# Patient Record
Sex: Female | Born: 1980 | Race: White | Hispanic: No | Marital: Married | Smoking: Current every day smoker
Health system: Southern US, Community
[De-identification: ages and names within clinical notes are randomized; demographics above are authoritative.]

## PROBLEM LIST (undated history)

## (undated) DIAGNOSIS — M329 Systemic lupus erythematosus, unspecified: Secondary | ICD-10-CM

## (undated) DIAGNOSIS — F319 Bipolar disorder, unspecified: Secondary | ICD-10-CM

## (undated) DIAGNOSIS — F329 Major depressive disorder, single episode, unspecified: Secondary | ICD-10-CM

## (undated) DIAGNOSIS — B1920 Unspecified viral hepatitis C without hepatic coma: Secondary | ICD-10-CM

## (undated) DIAGNOSIS — F419 Anxiety disorder, unspecified: Secondary | ICD-10-CM

## (undated) DIAGNOSIS — F32A Depression, unspecified: Secondary | ICD-10-CM

## (undated) DIAGNOSIS — F1920 Other psychoactive substance dependence, uncomplicated: Secondary | ICD-10-CM

## (undated) DIAGNOSIS — K2970 Gastritis, unspecified, without bleeding: Secondary | ICD-10-CM

## (undated) DIAGNOSIS — E7800 Pure hypercholesterolemia, unspecified: Secondary | ICD-10-CM

## (undated) HISTORY — PX: TONSILLECTOMY: SUR1361

## (undated) HISTORY — DX: Unspecified viral hepatitis C without hepatic coma: B19.20

## (undated) HISTORY — PX: TUBAL LIGATION: SHX77

---

## 2012-09-06 DIAGNOSIS — Z720 Tobacco use: Secondary | ICD-10-CM | POA: Insufficient documentation

## 2012-09-06 DIAGNOSIS — Z6835 Body mass index (BMI) 35.0-35.9, adult: Secondary | ICD-10-CM | POA: Insufficient documentation

## 2012-09-06 DIAGNOSIS — R509 Fever, unspecified: Secondary | ICD-10-CM | POA: Insufficient documentation

## 2012-11-12 ENCOUNTER — Ambulatory Visit: Payer: Self-pay | Admitting: Family Medicine

## 2012-12-29 DIAGNOSIS — M545 Low back pain, unspecified: Secondary | ICD-10-CM | POA: Insufficient documentation

## 2012-12-29 DIAGNOSIS — F410 Panic disorder [episodic paroxysmal anxiety] without agoraphobia: Secondary | ICD-10-CM | POA: Insufficient documentation

## 2013-01-02 ENCOUNTER — Encounter (HOSPITAL_COMMUNITY): Payer: Self-pay | Admitting: Emergency Medicine

## 2013-01-02 ENCOUNTER — Emergency Department (HOSPITAL_COMMUNITY)
Admission: EM | Admit: 2013-01-02 | Discharge: 2013-01-03 | Discharge status: Against Medical Advice | Payer: BC Managed Care – PPO | Attending: Emergency Medicine | Admitting: Emergency Medicine

## 2013-01-02 DIAGNOSIS — M7989 Other specified soft tissue disorders: Secondary | ICD-10-CM | POA: Insufficient documentation

## 2013-01-02 DIAGNOSIS — K089 Disorder of teeth and supporting structures, unspecified: Secondary | ICD-10-CM | POA: Insufficient documentation

## 2013-01-02 DIAGNOSIS — F17200 Nicotine dependence, unspecified, uncomplicated: Secondary | ICD-10-CM | POA: Insufficient documentation

## 2013-01-02 HISTORY — DX: Major depressive disorder, single episode, unspecified: F32.9

## 2013-01-02 HISTORY — DX: Anxiety disorder, unspecified: F41.9

## 2013-01-02 HISTORY — DX: Depression, unspecified: F32.A

## 2013-01-02 MED ORDER — ONDANSETRON 4 MG PO TBDP
4.0000 mg | ORAL_TABLET | Freq: Once | ORAL | Status: AC
  Administered 2013-01-03: 4 mg via ORAL
  Filled 2013-01-02: qty 1

## 2013-01-02 MED ORDER — HYDROCODONE-ACETAMINOPHEN 5-325 MG PO TABS
1.0000 | ORAL_TABLET | ORAL | Status: AC
  Administered 2013-01-03: 1 via ORAL
  Filled 2013-01-02: qty 1

## 2013-01-02 NOTE — ED Notes (Signed)
Pt reports dental pain and right buttock pain. States that she has an appt to have her tooth extracted but has run out of her pain medicine. States that right buttock became swollen and painful since yesterday. Rates pain for both complaints 10/10.

## 2013-01-02 NOTE — ED Notes (Signed)
C/o R lower toothache x 6 hours.  States she is currently taking antibiotics for same and planned to make an appt for extraction once antibiotics completed.  Also c/o swelling to R buttocks x 1 week.

## 2013-01-03 NOTE — ED Notes (Signed)
Pt reports that she can no longer wait to be seen by the EDP. Is requesting to leave. Encouraged pt to wait for the PA, but pt insisted on leaving AMA and following up with ehr dentist and PCP tomorrow.

## 2013-06-30 DIAGNOSIS — F988 Other specified behavioral and emotional disorders with onset usually occurring in childhood and adolescence: Secondary | ICD-10-CM | POA: Insufficient documentation

## 2013-06-30 DIAGNOSIS — F251 Schizoaffective disorder, depressive type: Secondary | ICD-10-CM | POA: Insufficient documentation

## 2013-11-28 DIAGNOSIS — M79642 Pain in left hand: Secondary | ICD-10-CM | POA: Insufficient documentation

## 2014-02-24 DIAGNOSIS — G5600 Carpal tunnel syndrome, unspecified upper limb: Secondary | ICD-10-CM | POA: Insufficient documentation

## 2014-02-24 DIAGNOSIS — S6720XA Crushing injury of unspecified hand, initial encounter: Secondary | ICD-10-CM | POA: Insufficient documentation

## 2014-08-09 ENCOUNTER — Emergency Department (HOSPITAL_COMMUNITY)
Admission: EM | Admit: 2014-08-09 | Discharge: 2014-08-09 | Disposition: A | Discharge status: Home / Self Care | Payer: BLUE CROSS/BLUE SHIELD | Attending: Emergency Medicine | Admitting: Emergency Medicine

## 2014-08-09 ENCOUNTER — Encounter (HOSPITAL_COMMUNITY): Payer: Self-pay | Admitting: *Deleted

## 2014-08-09 DIAGNOSIS — F419 Anxiety disorder, unspecified: Secondary | ICD-10-CM | POA: Diagnosis not present

## 2014-08-09 DIAGNOSIS — F1210 Cannabis abuse, uncomplicated: Secondary | ICD-10-CM | POA: Diagnosis not present

## 2014-08-09 DIAGNOSIS — R079 Chest pain, unspecified: Secondary | ICD-10-CM | POA: Diagnosis not present

## 2014-08-09 DIAGNOSIS — R002 Palpitations: Secondary | ICD-10-CM | POA: Diagnosis not present

## 2014-08-09 DIAGNOSIS — G479 Sleep disorder, unspecified: Secondary | ICD-10-CM | POA: Diagnosis not present

## 2014-08-09 DIAGNOSIS — Z79899 Other long term (current) drug therapy: Secondary | ICD-10-CM | POA: Insufficient documentation

## 2014-08-09 DIAGNOSIS — F319 Bipolar disorder, unspecified: Secondary | ICD-10-CM | POA: Insufficient documentation

## 2014-08-09 DIAGNOSIS — Z8659 Personal history of other mental and behavioral disorders: Secondary | ICD-10-CM

## 2014-08-09 DIAGNOSIS — F32A Depression, unspecified: Secondary | ICD-10-CM

## 2014-08-09 DIAGNOSIS — Z720 Tobacco use: Secondary | ICD-10-CM | POA: Insufficient documentation

## 2014-08-09 DIAGNOSIS — Z008 Encounter for other general examination: Secondary | ICD-10-CM | POA: Diagnosis present

## 2014-08-09 DIAGNOSIS — F329 Major depressive disorder, single episode, unspecified: Secondary | ICD-10-CM

## 2014-08-09 HISTORY — DX: Bipolar disorder, unspecified: F31.9

## 2014-08-09 LAB — RAPID URINE DRUG SCREEN, HOSP PERFORMED
AMPHETAMINES: NOT DETECTED
Barbiturates: POSITIVE — AB
Benzodiazepines: NOT DETECTED
Cocaine: NOT DETECTED
Opiates: NOT DETECTED
Tetrahydrocannabinol: POSITIVE — AB
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
## 2014-08-09 NOTE — Discharge Instructions (Signed)
Go to [facility] for out patient care. Address: [address], [place], [place] [address] [phone]  Call for a follow up appointment with a Family or Primary Care Provider.  Return if Symptoms worsen.   Take medication as prescribed.    Emergency Department Resource Guide 1) Find a Doctor and Pay Out of Pocket Although you won't have to find out who is covered by your insurance plan, it is a good idea to ask around and get recommendations. You will then need to call the office and see if the doctor you have chosen will accept you as a new patient and what types of options they offer for patients who are self-pay. Some doctors offer discounts or will set up payment plans for their patients who do not have insurance, but you will need to ask so you aren't surprised when you get to your appointment.  2) Contact Your Local Health Department Not all health departments have doctors that can see patients for sick visits, but many do, so it is worth a call to see if yours does. If you don't know where your local health department is, you can check in your phone book. The CDC also has a tool to help you locate your state's health department, and many state websites also have listings of all of their local health departments.  3) Find a [facility] If your illness is not likely to be very severe or complicated, you may want to try a walk in clinic. These are popping up all over the country in pharmacies, drugstores, and shopping centers. They're usually staffed by nurse practitioners or physician assistants that have been trained to treat common illnesses and complaints. They're usually fairly quick and inexpensive. However, if you have serious medical issues or chronic medical problems, these are probably not your best option.  No Primary Care Doctor: - Call Health Connect at  [phone] - they can help you locate a primary care doctor that  accepts your insurance, provides certain services,  etc. - Physician Referral Service- [phone]  Chronic Pain Problems: Organization         Address  Phone   Notes  [facility]  [phone] Patients need to be referred by their primary care doctor.   Medication Assistance: Organization         Address  Phone   Notes  [facility] Medication [facility] [place], [place] [address] [phone] --Must be a resident of [facility] -- Must have NO insurance coverage whatsoever (no Medicaid/ Medicare, etc.) -- The pt. MUST have a primary care doctor that directs their care regularly and follows them in the community   MedAssist  [phone]   [employer]  [phone]    Agencies that provide inexpensive medical care: Organization         Address  Phone   Notes  [place]  [phone]   [facility]    [phone]   [facility] [place], [place] [address] [phone]   [place] [address] [place] [phone]   Planned Parenthood    [phone]   [facility]    [phone]   [place] and [address], [place] Phone:  [phone], Fax:  [phone] Hours of Operation:  9 am - 6 pm, M-F.  Also  accepts Medicaid/Medicare and self-pay.  [facility] for [place], [address], [place] Phone: [phone], Fax: [phone]. Hours of Operation:  8:30 am - 5:30 pm, M-F.  Also accepts Medicaid and self-pay.  [facility] [place] [address] [place] Phone: [phone]   [place], [place], [place] [phone], Ext. [phone] Mondays & Thursdays: 7-9 AM.  First 15 patients are seen on a first come, first serve basis.    [place] Providers:  Organization         Address  Phone   Notes  [facility] [address], [place] [phone] Also accepts self-pay patients.  [facility] [address], [place]  [phone]   [place], [address], [place] [phone]   [facility] [address] [place] [phone]   Lucianne Lei [address], [place]   [phone] Only accepts [place] Access [place] patients after they have their name applied to their card.   Self-Pay (no insurance) in [facility]:  Organization         Address  Phone   Notes  Sickle Cell Patients, [facility] [place] [phone]   [facility] [place] [phone]   [facility] [place]  [place], [place], [place] [phone]   [facility]/Dr. Osei-Bonsu  [address], [place] or [address], [place] [phone] Phone number for both [place] and [place] locations is the same.  Urgent Medical and [facility] [address], [place] [phone]   [facility] [address] [place] or [address] [phone]   [facility] [address], [place] [phone], phone; [phone], fax Sees patients 1st and 3rd Saturday of every month.  Must not qualify for public or private insurance (i.e. Medicaid, Medicare, [place] Health Choice, Veterans' Benefits)  Household income should be no more than 200% of the poverty level The clinic cannot treat you if you are pregnant or think you are pregnant  Sexually transmitted diseases are not treated at the clinic.    Dental Care: Organization         Address  Phone  Notes  [facility] Department of [facility] [place] [phone] Accepts children up to  age 73 who are enrolled in [place] or [place]; pregnant women with a Medicaid card; and children who have applied for Medicaid or [place] Health Choice, but were declined, whose parents can pay a reduced fee at time of service.  [facility] Department of [facility]  [address], [place] [phone] Accepts children up to age 74 who are enrolled in [place] or [place]; pregnant women with a Medicaid card; and children who have applied for Medicaid or  Health Choice, but were declined, whose parents can pay a reduced fee at time of service.  [place] Adult Dental Access PROGRAM  [place] [phone] Patients are seen by appointment only. Walk-ins are not accepted. [place] will see patients 74 years of age and older. Monday - Tuesday (8am-5pm)  Most Wednesdays (8:30-5pm) $30 per visit, cash only  [facility] Adult [employer] PROGRAM  [address], [facility] [phone] Patients are seen by appointment only. Walk-ins are not accepted. [place] will see patients 52 years of age and older. One Wednesday Evening (Monthly: Volunteer Based).  $30 per visit, cash only  [place]  [phone] for adults; Children under age 41, call Graduate Pediatric Dentistry at [phone]. Children aged 70-14, please call [phone] to request a pediatric application.  Dental services are provided in all areas of dental care including fillings, crowns and bridges, complete and partial dentures, implants, gum treatment, root canals, and extractions. Preventive care is also provided. Treatment is provided to both adults and children. Patients are selected via a lottery and there is often a waiting list.   [facility] [address] [place]  [phone] [email or web address]   [facility] [address] [place], [place] [phone], Ext. [phone] Second and Fourth Thursday of  each month, opens at 6:30 AM; Clinic ends at 9 AM.  Patients are seen on a first-come first-served basis, and a limited number are seen during each clinic.   [facility]  [address] Hillard Danker [place], [place] [phone]   Eligibility Requirements You must have lived in [place], [place], or [place] counties for at least the last three months.   You cannot be eligible for state or federal sponsored [employer], including [employer], [place], or [employer].   You generally cannot be eligible for healthcare insurance through your employer.    How to apply: Eligibility screenings are held every Tuesday and Wednesday afternoon from 1:00 pm until 4:00 pm. You do not need an appointment for the interview!  [facility] [address] [place],    [place]  [place] Department  [place]  [phone]    Behavioral Health Resources in the Community: Intensive Outpatient Programs Organization         Address  Phone  Notes  [place]. [address], [place], [place] [phone]   [facility] Outpatient [address], [place], [place]   ADS: Alcohol & Drug Svcs [address], [place], [place]   [place] [address],  [place], [place] or [phone]   Substance Abuse Resources Organization         Address  Phone  Notes  Alcohol and Drug Services  [phone]   [place]  [phone]   [facility]   Chinita Pester  [phone]   Residential & Outpatient Substance Abuse Program  [phone]   Psychological Services Organization         Address  Phone  Notes  [facility] [place]  [place]  [phone]   [place] [address]  [place] or [phone]    Mobile Crisis Teams Organization         Address  Phone  Notes  [facility]rapeutic Alternatives, Mobile Crisis Care Unit  [phone]   Assertive Psychotherapeutic Services  [address] [place], [place]   [place] Levels [address] [place] [phone]    Self-Help/Support Groups Organization         Address  Phone  Notes  Mental Health Assoc. of [place] - variety of support groups  [place] Call for more information  Narcotics Anonymous (NA), Caring Services [address], [employer] [place]  2 meetings at this location   Special educational needs teacher         Address  Phone  Notes  ASAP Residential Treatment [place],    [place]  [phone]   [facility]  [address], [place] [phone], [place], [place]   [place], [place] [phone] Admissions: 8am-3pm M-F  Incentives Substance [place] [address],    [place], [place] [phone]   [facility] [address] [place], [place], [place]   [facility] [address]  [place], [place]   [facility] Programs - Intensive Outpatient [address] Kristeen [address], [place], [place]   [facility] ([place].) [place].,  [place], [place] [phone] or [phone]   [facility]idential Treatment Services (RTS) [address] [place], [place] Accepts Medicaid  [facility] [place] [address],  [place] [phone] Substance Abuse/Addiction Treatment   [facility] Organization         Address  Phone  Notes  [facility]  [phone]   Domenic Schwab, PhD [address] Arlis Porta [place], [place]   [phone] or [phone]   [place], [place] [phone]     [facility] [address], [place], [place] [phone] Insurance/Medicaid/sponsorship through [facility] and Families [address], [address]                                    [place], [place] [phone] [place] [address], [place] [phone]    Dr. Adele Schilder  [phone]   [facility] Dept. 1) [address], [place] 2) [place] 3)  [place], [place] [phone]  [phone]   [place] [phone] or [phone] (After Hours)

## 2014-08-09 NOTE — ED Notes (Signed)
Pt reports RUQ pain for several months . Pt was an inpatient at [facility] for treat ment of Bi-Polar disorder . Pt was D/C after 2 day inpatient  Treatment. Pt D/C on Keppra and has been taking Keppra 2 x day. Ptg last saw PCP one month ago for blood work.

## 2014-08-09 NOTE — ED Notes (Signed)
Pt comfortable with discharge and follow up instructions. Pt declines wheelchair, escorted to waiting area by this RN. No Prescriptions 
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
## 2014-10-23 ENCOUNTER — Emergency Department (HOSPITAL_COMMUNITY)
Admission: EM | Admit: 2014-10-23 | Discharge: 2014-10-23 | Disposition: A | Discharge status: Home / Self Care | Payer: BLUE CROSS/BLUE SHIELD | Attending: Emergency Medicine | Admitting: Emergency Medicine

## 2014-10-23 ENCOUNTER — Encounter (HOSPITAL_COMMUNITY): Payer: Self-pay | Admitting: *Deleted

## 2014-10-23 DIAGNOSIS — Z8639 Personal history of other endocrine, nutritional and metabolic disease: Secondary | ICD-10-CM | POA: Diagnosis not present

## 2014-10-23 DIAGNOSIS — Z720 Tobacco use: Secondary | ICD-10-CM | POA: Diagnosis not present

## 2014-10-23 DIAGNOSIS — L739 Follicular disorder, unspecified: Secondary | ICD-10-CM | POA: Diagnosis not present

## 2014-10-23 DIAGNOSIS — L0231 Cutaneous abscess of buttock: Secondary | ICD-10-CM | POA: Diagnosis present

## 2014-10-23 DIAGNOSIS — Z8659 Personal history of other mental and behavioral disorders: Secondary | ICD-10-CM | POA: Insufficient documentation

## 2014-10-23 HISTORY — DX: Pure hypercholesterolemia, unspecified: E78.00

## 2014-10-23 MED ORDER — SULFAMETHOXAZOLE-TRIMETHOPRIM 800-160 MG PO TABS: 2.0000 | ORAL_TABLET | Freq: Two times a day (BID) | ORAL | Status: DC

## 2014-10-23 MED ORDER — CLINDAMYCIN PHOSPHATE 1 % EX GEL
Freq: Two times a day (BID) | CUTANEOUS | Status: DC
Start: 2014-10-23 — End: 2015-07-29

## 2014-10-23 NOTE — ED Provider Notes (Signed)
CSN: [id number]     Arrival date & time 10/23/14  2595 History   First MD Initiated Contact with Patient 10/23/14 0825     Chief Complaint  Patient presents with  . Abscess     (Consider location/radiation/quality/duration/timing/severity/associated sxs/prior Treatment) HPI Comments: Patient presents today with a chief complaint of abscess.  She reports that she has had four tender raised erythematous areas of the left buttocks.  She reports that these areas have been present for the past 3 days and are gradually worsening.  She has not noticed any drainage from the area.  She denies fever at this time, but reports that she has had a fever intermittently over the past 2-3 days.  She reports that she was nauseous and had an episode of vomiting a few days ago, but no nausea at this time.  No treatment prior to arrival. No history of DM or HIV.    The history is provided by the patient.    Past Medical History  Diagnosis Date  . Anxiety   . Depression   . Bipolar affective disorder   . High cholesterol    Past Surgical History  Procedure Laterality Date  . Cesarean section    . Tonsillectomy     History reviewed. No pertinent family history. History  Substance Use Topics  . Smoking status: Current Every Day Smoker  . Smokeless tobacco: Not on file  . Alcohol Use: No   OB History    No data available     Review of Systems  All other systems reviewed and are negative.     Allergies  Review of patient's allergies indicates no known allergies.  Home Medications   Prior to Admission medications   Not on File   BP 124/72 mmHg  Pulse 102  Temp(Src) 98.2 F (36.8 C) (Oral)  Resp 18  SpO2 97%  LMP 10/23/2014 Physical Exam  Constitutional: She appears well-developed and well-nourished.  HENT:  Head: Normocephalic and atraumatic.  Mouth/Throat: Oropharynx is clear and moist.  Neck: Normal range of motion. Neck supple.  Cardiovascular: Normal rate, regular rhythm and  normal heart sounds.   Pulmonary/Chest: Effort normal and breath sounds normal.  Abdominal: Soft. Bowel sounds are normal. She exhibits no distension and no mass. There is no tenderness. There is no rebound and no guarding.  Neurological: She is alert.  Skin: Skin is warm and dry.  Four separate 1 cm erythematous tender papular inflamed areas of the left buttocks.  No drainage.  No fluctuance.    Psychiatric: She has a normal mood and affect.  Nursing note and vitals reviewed.   ED Course  Procedures (including critical care time) Labs Review Labs Reviewed - No data to display  Imaging Review No results found.   EKG Interpretation None      MDM   Final diagnoses:  None   Patient presents today with a chief complaint of abscess.   On exam she has 4 small raised, erythematous, non fluctuant areas of her left buttocks.   Areas evaluated with bed side ultrasound and no fluid collection visualized.  Appearance most consistent with a Folliculitis.  Patient started on antibiotics.  She is afebrile and non toxic appearing.  Stable for discharge.  Return precautions given.    Hyman Bible, PA-C 10/24/14 1556  Debby Freiberg, MD 10/26/14 [phone]

## 2014-10-23 NOTE — ED Notes (Signed)
Pt states she has had these bumps since Wednesday, reports nausea and "I'm just very sick"; reports measured fevers at home of 102.7. Temp at this time 99.1. NAD. VSS.

## 2014-10-23 NOTE — ED Notes (Signed)
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 Pt now has back pain, leg pain, nausea and fever.

## 2015-07-29 ENCOUNTER — Emergency Department (HOSPITAL_BASED_OUTPATIENT_CLINIC_OR_DEPARTMENT_OTHER)
Admission: EM | Admit: 2015-07-29 | Discharge: 2015-07-29 | Disposition: A | Discharge status: Home / Self Care | Payer: BLUE CROSS/BLUE SHIELD | Attending: Emergency Medicine | Admitting: Emergency Medicine

## 2015-07-29 ENCOUNTER — Emergency Department (HOSPITAL_BASED_OUTPATIENT_CLINIC_OR_DEPARTMENT_OTHER): Payer: BLUE CROSS/BLUE SHIELD

## 2015-07-29 ENCOUNTER — Encounter (HOSPITAL_BASED_OUTPATIENT_CLINIC_OR_DEPARTMENT_OTHER): Payer: Self-pay | Admitting: *Deleted

## 2015-07-29 DIAGNOSIS — Z3202 Encounter for pregnancy test, result negative: Secondary | ICD-10-CM | POA: Insufficient documentation

## 2015-07-29 DIAGNOSIS — M25531 Pain in right wrist: Secondary | ICD-10-CM | POA: Diagnosis not present

## 2015-07-29 DIAGNOSIS — M79641 Pain in right hand: Secondary | ICD-10-CM | POA: Insufficient documentation

## 2015-07-29 DIAGNOSIS — M25532 Pain in left wrist: Secondary | ICD-10-CM | POA: Insufficient documentation

## 2015-07-29 DIAGNOSIS — Z792 Long term (current) use of antibiotics: Secondary | ICD-10-CM | POA: Insufficient documentation

## 2015-07-29 DIAGNOSIS — Z79899 Other long term (current) drug therapy: Secondary | ICD-10-CM | POA: Insufficient documentation

## 2015-07-29 DIAGNOSIS — R109 Unspecified abdominal pain: Secondary | ICD-10-CM

## 2015-07-29 DIAGNOSIS — F17200 Nicotine dependence, unspecified, uncomplicated: Secondary | ICD-10-CM | POA: Diagnosis not present

## 2015-07-29 DIAGNOSIS — M2550 Pain in unspecified joint: Secondary | ICD-10-CM

## 2015-07-29 DIAGNOSIS — N390 Urinary tract infection, site not specified: Secondary | ICD-10-CM

## 2015-07-29 DIAGNOSIS — M79642 Pain in left hand: Secondary | ICD-10-CM | POA: Insufficient documentation

## 2015-07-29 DIAGNOSIS — Z9851 Tubal ligation status: Secondary | ICD-10-CM | POA: Diagnosis not present

## 2015-07-29 DIAGNOSIS — R1011 Right upper quadrant pain: Secondary | ICD-10-CM | POA: Diagnosis present

## 2015-07-29 HISTORY — DX: Systemic lupus erythematosus, unspecified: M32.9

## 2015-07-29 LAB — URINALYSIS, ROUTINE W REFLEX MICROSCOPIC
BILIRUBIN URINE: NEGATIVE
GLUCOSE, UA: NEGATIVE mg/dL
Ketones, ur: NEGATIVE mg/dL
Nitrite: NEGATIVE
PROTEIN: NEGATIVE mg/dL
SPECIFIC GRAVITY, URINE: 1.018 (ref 1.005–1.030)
pH: 6.5 (ref 5.0–8.0)

## 2015-07-29 LAB — CBC WITH DIFFERENTIAL/PLATELET
BASOS PCT: 1 %
Basophils Absolute: 0.1 10*3/uL (ref 0.0–0.1)
EOS ABS: 0.3 10*3/uL (ref 0.0–0.7)
Eosinophils Relative: 3 %
HEMATOCRIT: 33.5 % — AB (ref 36.0–46.0)
HEMOGLOBIN: 10.5 g/dL — AB (ref 12.0–15.0)
Lymphocytes Relative: 22 %
Lymphs Abs: 2.2 10*3/uL (ref 0.7–4.0)
MCH: 25.8 pg — ABNORMAL LOW (ref 26.0–34.0)
MCHC: 31.3 g/dL (ref 30.0–36.0)
MCV: 82.3 fL (ref 78.0–100.0)
Monocytes Absolute: 0.8 10*3/uL (ref 0.1–1.0)
Monocytes Relative: 8 %
NEUTROS ABS: 6.6 10*3/uL (ref 1.7–7.7)
NEUTROS PCT: 66 %
PLATELETS: 384 10*3/uL (ref 150–400)
RBC: 4.07 MIL/uL (ref 3.87–5.11)
RDW: 17.7 % — AB (ref 11.5–15.5)
WBC: 10 10*3/uL (ref 4.0–10.5)

## 2015-07-29 LAB — COMPREHENSIVE METABOLIC PANEL
ALBUMIN: 4.2 g/dL (ref 3.5–5.0)
ALT: 26 U/L (ref 14–54)
ANION GAP: 6 (ref 5–15)
AST: 22 U/L (ref 15–41)
Alkaline Phosphatase: 63 U/L (ref 38–126)
BILIRUBIN TOTAL: 0.3 mg/dL (ref 0.3–1.2)
BUN: 8 mg/dL (ref 6–20)
CHLORIDE: 106 mmol/L (ref 101–111)
CO2: 26 mmol/L (ref 22–32)
Calcium: 9.1 mg/dL (ref 8.9–10.3)
Creatinine, Ser: 0.83 mg/dL (ref 0.44–1.00)
GFR calc Af Amer: >60 mL/min (ref >60)
GLUCOSE: 96 mg/dL (ref 65–99)
POTASSIUM: 3.7 mmol/L (ref 3.5–5.1)
Sodium: 138 mmol/L (ref 135–145)
TOTAL PROTEIN: 7.6 g/dL (ref 6.5–8.1)

## 2015-07-29 LAB — PREGNANCY, URINE: PREG TEST UR: NEGATIVE

## 2015-07-29 LAB — URINE MICROSCOPIC-ADD ON

## 2015-07-29 MED ORDER — CEPHALEXIN 500 MG PO CAPS: 500.0000 mg | ORAL_CAPSULE | Freq: Four times a day (QID) | ORAL | Status: DC

## 2015-07-29 MED ORDER — OXYCODONE-ACETAMINOPHEN 5-325 MG PO TABS: 1.0000 | ORAL_TABLET | Freq: Four times a day (QID) | ORAL | Status: DC | PRN

## 2015-07-29 MED ORDER — ONDANSETRON HCL 4 MG/2ML IJ SOLN
4.0000 mg | Freq: Once | INTRAMUSCULAR | Status: AC
  Administered 2015-07-29: 4 mg via INTRAVENOUS
  Filled 2015-07-29: qty 2

## 2015-07-29 MED ORDER — PREDNISONE 20 MG PO TABS: ORAL_TABLET | ORAL | Status: DC

## 2015-07-29 MED ORDER — HYDROMORPHONE HCL 1 MG/ML IJ SOLN
0.5000 mg | Freq: Once | INTRAMUSCULAR | Status: AC
  Administered 2015-07-29: 0.5 mg via INTRAVENOUS
  Filled 2015-07-29: qty 1

## 2015-07-29 NOTE — ED Notes (Signed)
Patient transported to Ultrasound 

## 2015-07-29 NOTE — ED Provider Notes (Signed)
CSN: [id number]     Arrival date & time 07/29/15  1459 History   First MD Initiated Contact with Patient 07/29/15 1540     Chief Complaint  Patient presents with  . Abdominal Pain     (Consider location/radiation/quality/duration/timing/severity/associated sxs/prior Treatment) Patient is a 35 y.o. female presenting with abdominal pain.  Abdominal Pain Pain location:  RUQ Pain quality: throbbing   Pain radiates to:  Does not radiate Pain severity:  Severe Onset quality:  Gradual Duration:  2 days Timing:  Constant Progression:  Worsening Chronicity:  New Associated symptoms: nausea   Associated symptoms: no chills, no dysuria, no fever and no vomiting     Past Medical History  Diagnosis Date  . Anxiety   . Depression   . Bipolar affective disorder ([place])   . High cholesterol   . Lupus [facility])    Past Surgical History  Procedure Laterality Date  . Cesarean section    . Tonsillectomy    . Tubal ligation     History reviewed. No pertinent family history. Social History  Substance Use Topics  . Smoking status: Current Every Day Smoker  . Smokeless tobacco: None  . Alcohol Use: No   OB History    No data available     Review of Systems  Constitutional: Negative for fever and chills.  Gastrointestinal: Positive for nausea and abdominal pain. Negative for vomiting.  Genitourinary: Negative for dysuria.  Musculoskeletal: Positive for arthralgias.  All other systems reviewed and are negative.     Allergies  Review of patient's allergies indicates no known allergies.  Home Medications   Prior to Admission medications   Medication Sig Start Date End Date Taking? Authorizing Provider  clonazePAM (KLONOPIN) 1 MG tablet Take 1 mg by mouth 3 (three) times daily.    Historical Provider, MD  dicyclomine (BENTYL) 20 MG tablet Take 20 mg by mouth 3 (three) times daily before meals.    Historical Provider, MD  HYDROcodone-acetaminophen (NORCO/VICODIN) 5-325 MG per tablet  Take 1 tablet by mouth every 6 (six) hours as needed for moderate pain.    Historical Provider, MD  pantoprazole (PROTONIX) 40 MG tablet Take 40 mg by mouth daily.    Historical Provider, MD  sulfamethoxazole-trimethoprim (SEPTRA DS) 800-160 MG per tablet Take 2 tablets by mouth 2 (two) times daily. 10/23/14   Heather Laisure, PA-C   BP 111/80 mmHg  Pulse 86  Temp(Src) 98.4 F (36.9 C) (Oral)  Resp 20  Ht 5\' 3"  (1.6 m)  Wt 88.451 kg  BMI 34.55 kg/m2  SpO2 97%  LMP 07/22/2015 Physical Exam  Constitutional: She is oriented to person, place, and time. She appears well-developed and well-nourished. She appears distressed.  HENT:  Head: Normocephalic.  Eyes: Conjunctivae are normal.  Neck: Neck supple.  Cardiovascular: Normal rate and regular rhythm.   Pulmonary/Chest: Effort normal and breath sounds normal.  Abdominal: Soft. She exhibits no mass. There is tenderness in the right upper quadrant. There is no CVA tenderness.    Musculoskeletal: She exhibits edema and tenderness.  Multiple joints (hands, wrist) with tenderness and swelling.  Not hot to touch.  Neurological: She is alert and oriented to person, place, and time.  Skin: Skin is warm and dry.  Psychiatric: She has a normal mood and affect.  Nursing note and vitals reviewed.   ED Course  Procedures (including critical care time) Labs Review Labs Reviewed  URINALYSIS, ROUTINE W REFLEX MICROSCOPIC (NOT AT [facility]) - Abnormal; Notable for the following:  APPearance CLOUDY (*)    Hgb urine dipstick TRACE (*)    Leukocytes, UA SMALL (*)    All other components within normal limits  URINE MICROSCOPIC-ADD ON - Abnormal; Notable for the following:    Squamous Epithelial / LPF 0-5 (*)    Bacteria, UA MANY (*)    Casts HYALINE CASTS (*)    All other components within normal limits  CBC WITH DIFFERENTIAL/PLATELET - Abnormal; Notable for the following:    Hemoglobin 10.5 (*)    HCT 33.5 (*)    MCH 25.8 (*)    RDW 17.7 (*)     All other components within normal limits  PREGNANCY, URINE  COMPREHENSIVE METABOLIC PANEL    Imaging Review US Abdomen Complete  07/29/2015  CLINICAL DATA:  Patient with upper quadrant pain for 2 days. Abdominal bloating. EXAM: ABDOMEN ULTRASOUND COMPLETE COMPARISON:  None. FINDINGS: Gallbladder: The gallbladder is contracted. Mild wall thickening measuring 5 mm. Negative sonographic Murphy's sign. No pericholecystic fluid. No gallstones. Common bile duct: Diameter: 3 mm Liver: Liver is diffusely increased in echogenicity. No focal lesion identified. IVC: No abnormality visualized. Pancreas: Obscured due to overlying bowel gas. Spleen: Size and appearance within normal limits. Right Kidney: Length: 9.7 cm. Echogenicity within normal limits. No mass or hydronephrosis visualized. Left Kidney: Length: 11.0 cm. Echogenicity within normal limits. No mass or hydronephrosis visualized. Abdominal aorta: No aneurysm visualized. Other findings: None. IMPRESSION: There is mild gallbladder wall thickening, nonspecific however likely secondary to contracted state. No pericholecystic fluid. Negative sonographic Murphy sign. No gallstones. Hepatic steatosis. No hydronephrosis. Electronically Signed   By: Lovey Newcomer M.D.   On: 07/29/2015 17:30   I have personally reviewed and evaluated these images and lab results as part of my medical decision-making.   EKG Interpretation None     Radiology and lab results reviewed and shared with patient. No acute findings on [place]. Urinary tract infection.  Started on keflex. Recently diagnosed with lupus.  Having multiple areas of joint pain.  Has scheduled appointment with rheumatology.  Will start on short course of steroids. MDM   Final diagnoses:  None    Abdominal pain. UTI. Joint pain. Care instructions provided. Return precautions discussed.    Etta Quill, NP 07/29/15 2200  Medical screening examination/treatment/procedure(s) were performed by  non-physician practitioner and as supervising physician I was immediately available for consultation/collaboration.   EKG Interpretation None        Blanchie Dessert, MD 07/29/15 2236

## 2015-07-29 NOTE — ED Notes (Signed)
Pt c/o abd pain and bloating also  c/o "joint swelling" x 3 days HX lupus

## 2015-07-29 NOTE — Discharge Instructions (Signed)
Systemic Lupus Erythematosus, Adult Systemic lupus erythematosus is a long-term (chronic) disease that can affect many parts of the body. It can damage the skin, joints, blood vessels, brain, kidneys, lungs, heart, and other internal organs. It causes pain, irritation, and inflammation. Systemic lupus erythematosus is an autoimmune disease. With this type of disease, the body's defense system (immune system) mistakenly attacks normal tissues instead of attacking germs or abnormal growths. CAUSES The cause of this condition is not known. RISK FACTORS This condition is more likely to develop in:  Females.  People of Asian descent.  People of African-American descent.  People who have a family history of the condition. SYMPTOMS General symptoms include:  Joint pain and swelling (common).  Fever.  Fatigue.  Unusual weight loss or weight gain.  Skin rashes, especially over the nose and cheeks (butterfly rash) and after sun exposure.  Sores inside the mouth or nose. Other symptoms depend on which parts of the body are affected. They can include:  Shortness of breath.  Chest pain.  Frequent urination.  Blood in the urine.  Seizures.  Mental changes.  Hair loss.  Swollen and tender lymph nodes.  Swelling of the hands or feet. Symptoms can come and go. A period of time when symptoms get worse or come back is called a flare. A period of time with no symptoms is called a remission. DIAGNOSIS This condition is diagnosed based on symptoms, a medical history, and a physical exam. You may also have tests, including:  Blood tests.  Urine tests.  A chest X-ray.  A skin or kidney biopsy. For this test, a sample of tissue is taken from the skin or kidney and studied under a microscope. You may be referred to an autoimmune disease specialist (rheumatologist). TREATMENT There is no cure for this condition, but treatment can keep the disease in remission, help to control  symptoms, and prevent damage to the heart, lungs, kidneys, and other organs. Treatment may involve taking a combination of medicines over time. HOME CARE INSTRUCTIONS Medicines  Take medicines only as directed by your health care provider.  Do not take any medicines that contain estrogen without first checking with your health care provider. Estrogen can trigger flares and may increase your risk for blood clots. Lifestyle  Eat a heart-healthy diet.  Stay active as directed by your health care provider.  Do not smoke. If you need help quitting, ask your health care provider.  Protect your skin from the sun by applying sunblock and wearing protective hats and clothing.  Learn as much as you can about your condition and have a good support system in place. Support may come from family, friends, or a lupus support group. General Instructions  Keep all follow-up visits as directed by your health care provider. This is important.  Work closely with all of your health care providers to manage your condition.  Let your health care provider know right away if you become pregnant or if you plan to become pregnant. Pregnancy in women with this condition is considered high risk. SEEK MEDICAL CARE IF:  You have a fever.  Your symptoms flare.  You develop new symptoms.  You develop swollen feet or hands.  You develop puffiness around your eyes.  Your medicines are not working.  You have bloody, foamy, or coffee-colored urine.  There are changes in your urination. For example, you urinate more often at night.  You think that you may be depressed or have anxiety. [place]  IF:  You have chest pain.  You have trouble breathing.  You have a seizure.  You suddenly get a very bad headache.  You suddenly develop facial or body weakness.  You cannot speak.  You cannot understand speech.   This information is not intended to replace advice given to you by your  health care provider. Make sure you discuss any questions you have with your health care provider.   Document Released: 06/29/2002 Document Revised: 11/23/2014 Document Reviewed: 06/16/2014 Elsevier Interactive Patient Education 2016 Else[employer] Abdominal Pain, Adult Many things can cause abdominal pain. Usually, abdominal pain is not caused by a disease and will improve without treatment. It can often be observed and treated at home. Your health care provider will do a physical exam and possibly order blood tests and X-rays to help determine the seriousness of your pain. However, in many cases, more time must pass before a clear cause of the pain can be found. Before that point, your health care provider may not know if you need more testing or further treatment. HOME CARE INSTRUCTIONS Monitor your abdominal pain for any changes. The following actions may help to alleviate any discomfort you are experiencing:  Only take over-the-counter or prescription medicines as directed by your health care provider.  Do not take laxatives unless directed to do so by your health care provider.  Try a clear liquid diet (broth, tea, or water) as directed by your health care provider. Slowly move to a bland diet as tolerated. SEEK MEDICAL CARE IF:  You have unexplained abdominal pain.  You have abdominal pain associated with nausea or diarrhea.  You have pain when you urinate or have a bowel movement.  You experience abdominal pain that wakes you in the night.  You have abdominal pain that is worsened or improved by eating food.  You have abdominal pain that is worsened with eating fatty foods.  You have a fever. SEEK IMMEDIATE MEDICAL CARE IF:  Your pain does not go away within 2 hours.  You keep throwing up (vomiting).  Your pain is felt only in portions of the abdomen, such as the right side or the left lower portion of the abdomen.  You pass bloody or black tarry stools. MAKE SURE  YOU:  Understand these instructions.  Will watch your condition.  Will get help right away if you are not doing well or get worse.   This information is not intended to replace advice given to you by your health care provider. Make sure you discuss any questions you have with your health care provider.   Document Released: 04/18/2005 Document Revised: 03/30/2015 Document Reviewed: 03/18/2013 Elsevier Interactive Patient Education 2016 Elsevier Inc. Urinary Tract Infection Urinary tract infections (UTIs) can develop anywhere along your urinary tract. Your urinary tract is your body's drainage system for removing wastes and extra water. Your urinary tract includes two kidneys, two ureters, a bladder, and a urethra. Your kidneys are a pair of bean-shaped organs. Each kidney is about the size of your fist. They are located below your ribs, one on each side of your spine. CAUSES Infections are caused by microbes, which are microscopic organisms, including fungi, viruses, and bacteria. These organisms are so small that they can only be seen through a microscope. Bacteria are the microbes that most commonly cause UTIs. SYMPTOMS  Symptoms of UTIs may vary by age and gender of the patient and by the location of the infection. Symptoms in young women typically include a frequent  and intense urge to urinate and a painful, burning feeling in the bladder or urethra during urination. Older women and men are more likely to be tired, shaky, and weak and have muscle aches and abdominal pain. A fever may mean the infection is in your kidneys. Other symptoms of a kidney infection include pain in your back or sides below the ribs, nausea, and vomiting. DIAGNOSIS To diagnose a UTI, your caregiver will ask you about your symptoms. Your caregiver will also ask you to provide a urine sample. The urine sample will be tested for bacteria and white blood cells. White blood cells are made by your body to help fight  infection. TREATMENT  Typically, UTIs can be treated with medication. Because most UTIs are caused by a bacterial infection, they usually can be treated with the use of antibiotics. The choice of antibiotic and length of treatment depend on your symptoms and the type of bacteria causing your infection. HOME CARE INSTRUCTIONS  If you were prescribed antibiotics, take them exactly as your caregiver instructs you. Finish the medication even if you feel better after you have only taken some of the medication.  Drink enough water and fluids to keep your urine clear or pale yellow.  Avoid caffeine, tea, and carbonated beverages. They tend to irritate your bladder.  Empty your bladder often. Avoid holding urine for long periods of time.  Empty your bladder before and after sexual intercourse.  After a bowel movement, women should cleanse from front to back. Use each tissue only once. SEEK MEDICAL CARE IF:   You have back pain.  You develop a fever.  Your symptoms do not begin to resolve within 3 days. SEEK IMMEDIATE MEDICAL CARE IF:   You have severe back pain or lower abdominal pain.  You develop chills.  You have nausea or vomiting.  You have continued burning or discomfort with urination. MAKE SURE YOU:   Understand these instructions.  Will watch your condition.  Will get help right away if you are not doing well or get worse.   This information is not intended to replace advice given to you by your health care provider. Make sure you discuss any questions you have with your health care provider.   Document Released: 04/18/2005 Document Revised: 03/30/2015 Document Reviewed: 08/17/2011 Elsevier Interactive Patient Education [employer].

## 2016-04-29 DIAGNOSIS — F1120 Opioid dependence, uncomplicated: Secondary | ICD-10-CM | POA: Insufficient documentation

## 2016-04-29 DIAGNOSIS — F29 Unspecified psychosis not due to a substance or known physiological condition: Secondary | ICD-10-CM | POA: Insufficient documentation

## 2016-04-29 DIAGNOSIS — F1210 Cannabis abuse, uncomplicated: Secondary | ICD-10-CM | POA: Insufficient documentation

## 2016-04-29 DIAGNOSIS — F1320 Sedative, hypnotic or anxiolytic dependence, uncomplicated: Secondary | ICD-10-CM | POA: Insufficient documentation

## 2017-01-01 DIAGNOSIS — D259 Leiomyoma of uterus, unspecified: Secondary | ICD-10-CM | POA: Insufficient documentation

## 2017-01-01 DIAGNOSIS — S82201A Unspecified fracture of shaft of right tibia, initial encounter for closed fracture: Secondary | ICD-10-CM | POA: Insufficient documentation

## 2017-01-02 DIAGNOSIS — S82871A Displaced pilon fracture of right tibia, initial encounter for closed fracture: Secondary | ICD-10-CM | POA: Insufficient documentation

## 2017-01-02 DIAGNOSIS — Z8719 Personal history of other diseases of the digestive system: Secondary | ICD-10-CM | POA: Insufficient documentation

## 2017-01-02 DIAGNOSIS — N200 Calculus of kidney: Secondary | ICD-10-CM | POA: Insufficient documentation

## 2017-01-02 DIAGNOSIS — E876 Hypokalemia: Secondary | ICD-10-CM | POA: Insufficient documentation

## 2017-07-03 DIAGNOSIS — D689 Coagulation defect, unspecified: Secondary | ICD-10-CM | POA: Insufficient documentation

## 2017-07-03 DIAGNOSIS — Z9109 Other allergy status, other than to drugs and biological substances: Secondary | ICD-10-CM | POA: Insufficient documentation

## 2017-07-03 DIAGNOSIS — K219 Gastro-esophageal reflux disease without esophagitis: Secondary | ICD-10-CM | POA: Insufficient documentation

## 2017-07-03 DIAGNOSIS — R569 Unspecified convulsions: Secondary | ICD-10-CM | POA: Insufficient documentation

## 2017-07-03 DIAGNOSIS — K760 Fatty (change of) liver, not elsewhere classified: Secondary | ICD-10-CM | POA: Insufficient documentation

## 2017-07-03 DIAGNOSIS — M797 Fibromyalgia: Secondary | ICD-10-CM | POA: Insufficient documentation

## 2017-07-03 DIAGNOSIS — H9390 Unspecified disorder of ear, unspecified ear: Secondary | ICD-10-CM | POA: Insufficient documentation

## 2017-07-03 DIAGNOSIS — F419 Anxiety disorder, unspecified: Secondary | ICD-10-CM | POA: Insufficient documentation

## 2017-07-11 DIAGNOSIS — K811 Chronic cholecystitis: Secondary | ICD-10-CM | POA: Insufficient documentation

## 2017-07-31 DIAGNOSIS — S93431A Sprain of tibiofibular ligament of right ankle, initial encounter: Secondary | ICD-10-CM | POA: Insufficient documentation

## 2017-08-30 DIAGNOSIS — M3500 Sicca syndrome, unspecified: Secondary | ICD-10-CM | POA: Insufficient documentation

## 2017-09-03 DIAGNOSIS — K2900 Acute gastritis without bleeding: Secondary | ICD-10-CM | POA: Insufficient documentation

## 2018-04-20 DIAGNOSIS — N946 Dysmenorrhea, unspecified: Secondary | ICD-10-CM | POA: Insufficient documentation

## 2018-04-20 DIAGNOSIS — N920 Excessive and frequent menstruation with regular cycle: Secondary | ICD-10-CM | POA: Insufficient documentation

## 2018-05-11 DIAGNOSIS — J189 Pneumonia, unspecified organism: Secondary | ICD-10-CM | POA: Insufficient documentation

## 2018-06-06 DIAGNOSIS — K429 Umbilical hernia without obstruction or gangrene: Secondary | ICD-10-CM | POA: Insufficient documentation

## 2018-06-30 DIAGNOSIS — Z9889 Other specified postprocedural states: Secondary | ICD-10-CM | POA: Insufficient documentation

## 2018-06-30 DIAGNOSIS — E559 Vitamin D deficiency, unspecified: Secondary | ICD-10-CM | POA: Insufficient documentation

## 2019-04-10 ENCOUNTER — Encounter (HOSPITAL_BASED_OUTPATIENT_CLINIC_OR_DEPARTMENT_OTHER): Payer: Self-pay | Admitting: Emergency Medicine

## 2019-04-10 ENCOUNTER — Other Ambulatory Visit: Payer: Self-pay

## 2019-04-10 ENCOUNTER — Emergency Department (HOSPITAL_BASED_OUTPATIENT_CLINIC_OR_DEPARTMENT_OTHER): Payer: Medicaid Other

## 2019-04-10 ENCOUNTER — Emergency Department (HOSPITAL_BASED_OUTPATIENT_CLINIC_OR_DEPARTMENT_OTHER)
Admission: EM | Admit: 2019-04-10 | Discharge: 2019-04-10 | Disposition: A | Discharge status: Home / Self Care | Payer: Medicaid Other | Attending: Emergency Medicine | Admitting: Emergency Medicine

## 2019-04-10 DIAGNOSIS — R1013 Epigastric pain: Secondary | ICD-10-CM | POA: Diagnosis not present

## 2019-04-10 DIAGNOSIS — F17200 Nicotine dependence, unspecified, uncomplicated: Secondary | ICD-10-CM | POA: Insufficient documentation

## 2019-04-10 DIAGNOSIS — R072 Precordial pain: Secondary | ICD-10-CM | POA: Insufficient documentation

## 2019-04-10 DIAGNOSIS — Z79899 Other long term (current) drug therapy: Secondary | ICD-10-CM | POA: Diagnosis not present

## 2019-04-10 LAB — CBC WITH DIFFERENTIAL/PLATELET
Abs Immature Granulocytes: 0.05 10*3/uL (ref 0.00–0.07)
Basophils Absolute: 0.1 10*3/uL (ref 0.0–0.1)
Basophils Relative: 1 %
Eosinophils Absolute: 0.3 10*3/uL (ref 0.0–0.5)
Eosinophils Relative: 3 %
HCT: 31.1 % — ABNORMAL LOW (ref 36.0–46.0)
Hemoglobin: 9.4 g/dL — ABNORMAL LOW (ref 12.0–15.0)
Immature Granulocytes: 1 %
Lymphocytes Relative: 22 %
Lymphs Abs: 2 10*3/uL (ref 0.7–4.0)
MCH: 24.3 pg — ABNORMAL LOW (ref 26.0–34.0)
MCHC: 30.2 g/dL (ref 30.0–36.0)
MCV: 80.4 fL (ref 80.0–100.0)
Monocytes Absolute: 0.7 10*3/uL (ref 0.1–1.0)
Monocytes Relative: 8 %
Neutro Abs: 6 10*3/uL (ref 1.7–7.7)
Neutrophils Relative %: 65 %
Platelets: 276 10*3/uL (ref 150–400)
RBC: 3.87 MIL/uL (ref 3.87–5.11)
RDW: 16.4 % — ABNORMAL HIGH (ref 11.5–15.5)
WBC: 9.2 10*3/uL (ref 4.0–10.5)
nRBC: 0 % (ref 0.0–0.2)

## 2019-04-10 LAB — COMPREHENSIVE METABOLIC PANEL
ALT: 19 U/L (ref 0–44)
AST: 26 U/L (ref 15–41)
Albumin: 4 g/dL (ref 3.5–5.0)
Alkaline Phosphatase: 83 U/L (ref 38–126)
Anion gap: 8 (ref 5–15)
BUN: 14 mg/dL (ref 6–20)
CO2: 26 mmol/L (ref 22–32)
Calcium: 9.1 mg/dL (ref 8.9–10.3)
Chloride: 102 mmol/L (ref 98–111)
Creatinine, Ser: 0.91 mg/dL (ref 0.44–1.00)
GFR calc Af Amer: >60 mL/min (ref >60)
GFR calc non Af Amer: >60 mL/min (ref >60)
Glucose, Bld: 58 mg/dL — ABNORMAL LOW (ref 70–99)
Potassium: 3.7 mmol/L (ref 3.5–5.1)
Sodium: 136 mmol/L (ref 135–145)
Total Bilirubin: <0.1 mg/dL — ABNORMAL LOW (ref 0.3–1.2)
Total Protein: 7.6 g/dL (ref 6.5–8.1)

## 2019-04-10 LAB — LIPASE, BLOOD: Lipase: 41 U/L (ref 11–51)

## 2019-04-10 MED ORDER — LIDOCAINE VISCOUS HCL 2 % MT SOLN
15.0000 mL | Freq: Once | OROMUCOSAL | Status: AC
  Administered 2019-04-10: 19:00:00 15 mL via ORAL
  Filled 2019-04-10: qty 15

## 2019-04-10 MED ORDER — ONDANSETRON HCL 4 MG/2ML IJ SOLN
4.0000 mg | Freq: Once | INTRAMUSCULAR | Status: AC
  Administered 2019-04-10: 17:00:00 4 mg via INTRAVENOUS
  Filled 2019-04-10: qty 2

## 2019-04-10 MED ORDER — PANTOPRAZOLE SODIUM 40 MG PO TBEC: 40.0000 mg | DELAYED_RELEASE_TABLET | Freq: Every day | ORAL | 0 refills | Status: DC

## 2019-04-10 MED ORDER — SODIUM CHLORIDE 0.9 % IV BOLUS
500.0000 mL | Freq: Once | INTRAVENOUS | Status: AC
  Administered 2019-04-10: 17:00:00 500 mL via INTRAVENOUS

## 2019-04-10 MED ORDER — FENTANYL CITRATE (PF) 100 MCG/2ML IJ SOLN
50.0000 ug | Freq: Once | INTRAMUSCULAR | Status: AC
  Administered 2019-04-10: 17:00:00 50 ug via INTRAVENOUS
  Filled 2019-04-10: qty 2

## 2019-04-10 MED ORDER — ALUM & MAG HYDROXIDE-SIMETH 200-200-20 MG/5ML PO SUSP
30.0000 mL | Freq: Once | ORAL | Status: AC
  Administered 2019-04-10: 30 mL via ORAL
  Filled 2019-04-10: qty 30

## 2019-04-10 MED ORDER — PROMETHAZINE HCL 25 MG PO TABS: 25.0000 mg | ORAL_TABLET | Freq: Four times a day (QID) | ORAL | 0 refills | Status: DC | PRN

## 2019-04-10 MED ORDER — SUCRALFATE 1 GM/10ML PO SUSP: 1.0000 g | Freq: Three times a day (TID) | ORAL | 0 refills | Status: DC

## 2019-04-10 MED ORDER — PROMETHAZINE HCL 25 MG PO TABS
25.0000 mg | ORAL_TABLET | Freq: Once | ORAL | Status: AC
  Administered 2019-04-10: 19:00:00 25 mg via ORAL
  Filled 2019-04-10: qty 1

## 2019-04-10 MED ORDER — SODIUM CHLORIDE 0.9 % IV BOLUS
500.0000 mL | Freq: Once | INTRAVENOUS | Status: AC
  Administered 2019-04-10: 500 mL via INTRAVENOUS

## 2019-04-10 MED ORDER — IOHEXOL 300 MG/ML  SOLN
100.0000 mL | Freq: Once | INTRAMUSCULAR | Status: AC | PRN
  Administered 2019-04-10: 18:00:00 100 mL via INTRAVENOUS

## 2019-04-10 NOTE — ED Notes (Signed)
Pt on monitor 

## 2019-04-10 NOTE — ED Notes (Signed)
Pt unable to provide urine at this time. Pt will call us when ready.

## 2019-04-10 NOTE — ED Notes (Signed)
Lower abd pain x 1 month w epigastric pain  Feels like she can feel stool passing through colon  Hard to swallow at times

## 2019-04-10 NOTE — Discharge Instructions (Addendum)
Please read and follow all provided instructions.  Your diagnoses today include:  1. Epigastric pain     Tests performed today include: Blood counts and electrolytes Blood tests to check liver and kidney function Blood tests to check pancreas function CT abdomen/pelvis -no acute findings Vital signs. See below for your results today.   Medications prescribed:   Take any prescribed medications only as directed.  Home care instructions:  Follow any educational materials contained in this packet.  Follow-up instructions: Please follow-up with your primary care provider in the next 2 days for further evaluation of your symptoms.    Return instructions:  SEEK IMMEDIATE MEDICAL ATTENTION IF: The pain does not go away or becomes severe  A temperature above 101F develops  Repeated vomiting occurs (multiple episodes)  The pain becomes localized to portions of the abdomen. The right side could possibly be appendicitis. In an adult, the left lower portion of the abdomen could be colitis or diverticulitis.  Blood is being passed in stools or vomit (bright red or black tarry stools)  You develop chest pain, difficulty breathing, dizziness or fainting, or become confused, poorly responsive, or inconsolable (young children) If you have any other emergent concerns regarding your health  Additional Information: Abdominal (belly) pain can be caused by many things. Your caregiver performed an examination and possibly ordered blood/urine tests and imaging (CT scan, x-rays, ultrasound). Many cases can be observed and treated at home after initial evaluation in the emergency department. Even though you are being discharged home, abdominal pain can be unpredictable. Therefore, you need a repeated exam if your pain does not resolve, returns, or worsens. Most patients with abdominal pain don't have to be admitted to the hospital or have surgery, but serious problems like appendicitis and gallbladder attacks  can start out as nonspecific pain. Many abdominal conditions cannot be diagnosed in one visit, so follow-up evaluations are very important.  Your vital signs today were: BP 109/62    Pulse 76    Temp 98.4 F (36.9 C) (Oral)    Resp 19    Ht 5\' 4"  (1.626 m)    Wt 81.6 kg    LMP  (LMP Unknown)    SpO2 96%    BMI 30.90 kg/m  If your blood pressure (bp) was elevated above 135/85 this visit, please have this repeated by your doctor within one month. --------------

## 2019-04-10 NOTE — ED Notes (Signed)
Returned from ct 

## 2019-04-10 NOTE — ED Notes (Signed)
Asked pt for a Urine specimen but she said she could not yet.

## 2019-04-10 NOTE — ED Provider Notes (Signed)
[facility] EMERGENCY DEPARTMENT Provider Note   CSN: [id number] Arrival date & time: 04/10/19  1541     History   Chief Complaint No chief complaint on file.   HPI Casey Mitchell is a 38 y.o. female.     Patient presents the emergency department with chief complaint of abdominal pain.  Patient reports a history of cholecystectomy.  She has had pain in her upper abdomen, epigastric to left-sided.  She states that it radiates to her back.  She states that she has had pancreatitis in the past and this feels similar.  She reports vomiting at home.  Pain is been ongoing for 2 days.  It radiates also into her chest as well.  No urinary symptoms.  She has been having 2 bowel movements a day, soft, nonbloody.  No fevers.  No diaphoresis, exertional pain.  Patient does not have very many visits in our system however review of outside systems show a history of intentional drug overdose, narcotics dependence, drug-seeking behavior.     Past Medical History:  Diagnosis Date   Anxiety    Bipolar affective disorder ([place])    Depression    High cholesterol    Lupus ([place])     Patient Active Problem List   Diagnosis Date Noted   Bipolar affective disorder [facility])     Past Surgical History:  Procedure Laterality Date   CESAREAN SECTION     TONSILLECTOMY     TUBAL LIGATION       OB History   No obstetric history on file.      Home Medications    Prior to Admission medications   Medication Sig Start Date End Date Taking? Authorizing Provider  cephALEXin (KEFLEX) 500 MG capsule Take 1 capsule (500 mg total) by mouth 4 (four) times daily. 07/29/15   Etta Quill, NP  clonazePAM (KLONOPIN) 1 MG tablet Take 1 mg by mouth 3 (three) times daily.    [provider]  dicyclomine (BENTYL) 20 MG tablet Take 20 mg by mouth 3 (three) times daily before meals.    [provider]  HYDROcodone-acetaminophen (NORCO/VICODIN) 5-325 MG per tablet Take 1 tablet by  mouth every 6 (six) hours as needed for moderate pain.    [provider]  oxyCODONE-acetaminophen (PERCOCET/ROXICET) 5-325 MG tablet Take 1 tablet by mouth every 6 (six) hours as needed for severe pain. 07/29/15   Etta Quill, NP  pantoprazole (PROTONIX) 40 MG tablet Take 40 mg by mouth daily.    [provider]  predniSONE (DELTASONE) 20 MG tablet 3 tabs po day one, then 2 tabs daily x 4 days 07/29/15   Etta Quill, NP  sulfamethoxazole-trimethoprim (SEPTRA DS) 800-160 MG per tablet Take 2 tablets by mouth 2 (two) times daily. 10/23/14   Hyman Bible, PA-C    Family History No family history on file.  Social History Social History   Tobacco Use   Smoking status: Current Every Day Smoker   Smokeless tobacco: Current User  Substance Use Topics   Alcohol use: No   Drug use: No     Allergies   Aripiprazole and Olanzapine   Review of Systems Review of Systems  Constitutional: Negative for fever.  HENT: Negative for rhinorrhea and sore throat.   Eyes: Negative for redness.  Respiratory: Negative for cough and shortness of breath.   Cardiovascular: Positive for chest pain.  Gastrointestinal: Positive for abdominal pain, nausea and vomiting. Negative for diarrhea.  Genitourinary: Negative for dysuria.  Musculoskeletal: Negative  for myalgias.  Skin: Negative for rash.  Neurological: Negative for headaches.     Physical Exam Updated Vital Signs BP 119/77 (BP Location: Right Arm)    Pulse 78    Temp 98.4 F (36.9 C) (Oral)    Resp 18    Ht 5\' 4"  (1.626 m)    Wt 81.6 kg    LMP  (LMP Unknown)    SpO2 100%    BMI 30.90 kg/m   Physical Exam Vitals signs and nursing note reviewed.  Constitutional:      Appearance: She is well-developed.     Comments: Pt crying.   HENT:     Head: Normocephalic and atraumatic.  Eyes:     General:        Right eye: No discharge.        Left eye: No discharge.     Conjunctiva/sclera: Conjunctivae normal.  Neck:      Musculoskeletal: Normal range of motion and neck supple.  Cardiovascular:     Rate and Rhythm: Normal rate and regular rhythm.     Heart sounds: Normal heart sounds.  Pulmonary:     Effort: Pulmonary effort is normal.     Breath sounds: Normal breath sounds.  Abdominal:     Palpations: Abdomen is soft.     Tenderness: There is abdominal tenderness. There is no guarding or rebound.     Comments: Epigastric pain to palpation.   Skin:    General: Skin is warm and dry.  Neurological:     Mental Status: She is alert.      ED Treatments / Results  Labs (all labs ordered are listed, but only abnormal results are displayed) Labs Reviewed  CBC WITH DIFFERENTIAL/PLATELET - Abnormal; Notable for the following components:      Result Value   Hemoglobin 9.4 (*)    HCT 31.1 (*)    MCH 24.3 (*)    RDW 16.4 (*)    All other components within normal limits  COMPREHENSIVE METABOLIC PANEL - Abnormal; Notable for the following components:   Glucose, Bld 58 (*)    Total Bilirubin <0.1 (*)    All other components within normal limits  LIPASE, BLOOD    EKG EKG Interpretation  Date/Time:  Friday April 10 2019 16:25:04 EDT Ventricular Rate:  75 PR Interval:    QRS Duration: 92 QT Interval:  405 QTC Calculation: 453 R Axis:   98 Text Interpretation:  Sinus rhythm Borderline right axis deviation no prior available for comparison Confirmed by Quintella Reichert [phone]) on 04/10/2019 4:28:30 PM  .[facility] Rad[facility] Dg Chest Portable 1 View  Result Date: 04/10/2019 CLINICAL DATA:  Substernal chest pain and epigastric pain. EXAM: PORTABLE CHEST 1 VIEW COMPARISON:  05/28/2018 FINDINGS: The heart size and mediastinal contours are within normal limits. Both lungs are clear. The visualized skeletal structures are unremarkable. IMPRESSION: No active disease. Electronically Signed   By: Marlaine Hind M.D.   On: 04/10/2019 16:50    Procedures Procedures (including critical care time)  Medications  Ordered in ED Medications  sodium chloride 0.9 % bolus 500 mL (has no administration in time range)  fentaNYL (SUBLIMAZE) injection 50 mcg (50 mcg Intravenous Given 04/10/19 1636)  ondansetron (ZOFRAN) injection 4 mg (4 mg Intravenous Given 04/10/19 1636)  sodium chloride 0.9 % bolus 500 mL (500 mLs Intravenous New Bag/Given 04/10/19 1635)     Initial Impression / Assessment and Plan / ED Course  I have reviewed the triage vital signs and the  nursing notes.  Pertinent labs & imaging results that were available during my care of the patient were reviewed by me and considered in my medical decision making (see chart for details).        Patient seen and examined. Reviewed records. ED visit at [facility] 7/11 for abd pain. Hgb 9.6, WBC 9.7k. CMP, lipase neg. UA with some blood. Positive benzo, methadone, oxycodone on drug screen.  CT renal protocol with no stone, no other abnormality, + constipation.   Vital signs reviewed and are as follows: BP 119/77 (BP Location: Right Arm)    Pulse 78    Temp 98.4 F (36.9 C) (Oral)    Resp 18    Ht 5\' 4"  (1.626 m)    Wt 81.6 kg    LMP  (LMP Unknown)    SpO2 100%    BMI 30.90 kg/m   4:50 PM EKG okay.   5:13 PM rechecked patient.  When I entered the room she was sleeping.  When I woke her up she immediately started groaning.  We discussed recent CT scan 2 months ago.  She tells me that her pain is different today than it was then.  Discussed that her lab work looks reassuring.  Patient then asked me why she is passing out.  We discussed this further and she had an episode of passing out a couple years ago and another episode about a month ago.  No recent episodes.  We discussed this could be due to a bunch of different reasons however her EKG looks normal today.  She has not had any episodes of syncope since the onset of her pain.  Discussed repeated CT imaging to rule out bowel perforation, free air, further rule out pancreatitis.  Patient wants to proceed.   Unfortunately she is telling me that her pain is different today than what she was evaluated for in the past.  6:51 PM CT findings reviewed, it is negative.   7:15 PM patient updated on results.  She is requesting oral Phenergan as this works better than Zofran.  Will give a dose here.  Plan to discharged home with continued use of Protonix.  Will give Carafate, Phenergan for home.  Encourage PCP follow-up next week.  The patient was urged to return to the Emergency Department immediately with worsening of current symptoms, worsening abdominal pain, persistent vomiting, blood noted in stools, fever, or any other concerns. The patient verbalized understanding.   7:58 PM Repeat EKG unchanged.   Final Clinical Impressions(s) / ED Diagnoses   Final diagnoses:  Epigastric pain   Patient with abdominal pain. Vitals are stable, no fever. Labs reassuring, baseline hgb. Imaging negative.  Patient's main complaint is abdominal pain that radiates into her chest.  EKG nonischemic and unchanged.  Chest x-ray is clear.  Do not suspect ACS.  No signs of dehydration, patient is tolerating PO's. Lungs are clear and no signs suggestive of PNA. Low concern for appendicitis, cholecystitis (previous cholecystectomy), pancreatitis, ruptured viscus, UTI, kidney stone, aortic dissection, aortic aneurysm or other emergent abdominal etiology. Supportive therapy indicated with return if symptoms worsen.    ED Discharge Orders         Ordered    pantoprazole (PROTONIX) 40 MG tablet  Daily     04/10/19 1958    sucralfate (CARAFATE) 1 GM/10ML suspension  3 times daily with meals & bedtime     04/10/19 1958    promethazine (PHENERGAN) 25 MG tablet  Every 6 hours PRN  04/10/19 1958           Carlisle Cater, PA-C 04/10/19 2002    Quintella Reichert, MD 04/11/19 1259

## 2019-04-10 NOTE — ED Notes (Signed)
Unable to give urine sample at present

## 2019-04-10 NOTE — ED Triage Notes (Signed)
Abdominal pain for 2 days. States " I think its my Pancreatitis". States Vomiting and diarrhea today.

## 2019-05-13 ENCOUNTER — Encounter (HOSPITAL_BASED_OUTPATIENT_CLINIC_OR_DEPARTMENT_OTHER): Payer: Self-pay | Admitting: Emergency Medicine

## 2019-05-13 ENCOUNTER — Other Ambulatory Visit: Payer: Self-pay

## 2019-05-13 ENCOUNTER — Emergency Department (HOSPITAL_BASED_OUTPATIENT_CLINIC_OR_DEPARTMENT_OTHER): Payer: Medicaid Other

## 2019-05-13 ENCOUNTER — Inpatient Hospital Stay (HOSPITAL_BASED_OUTPATIENT_CLINIC_OR_DEPARTMENT_OTHER)
Admission: EM | Admit: 2019-05-13 | Discharge: 2019-05-17 | DRG: 603 | Disposition: A | Discharge status: Home / Self Care | Payer: Medicaid Other | Attending: Internal Medicine | Admitting: Internal Medicine

## 2019-05-13 DIAGNOSIS — R001 Bradycardia, unspecified: Secondary | ICD-10-CM | POA: Diagnosis not present

## 2019-05-13 DIAGNOSIS — Z888 Allergy status to other drugs, medicaments and biological substances status: Secondary | ICD-10-CM | POA: Diagnosis not present

## 2019-05-13 DIAGNOSIS — F17220 Nicotine dependence, chewing tobacco, uncomplicated: Secondary | ICD-10-CM | POA: Diagnosis present

## 2019-05-13 DIAGNOSIS — M329 Systemic lupus erythematosus, unspecified: Secondary | ICD-10-CM | POA: Diagnosis present

## 2019-05-13 DIAGNOSIS — G8929 Other chronic pain: Secondary | ICD-10-CM | POA: Diagnosis present

## 2019-05-13 DIAGNOSIS — L03211 Cellulitis of face: Principal | ICD-10-CM | POA: Diagnosis present

## 2019-05-13 DIAGNOSIS — Z792 Long term (current) use of antibiotics: Secondary | ICD-10-CM

## 2019-05-13 DIAGNOSIS — Z20828 Contact with and (suspected) exposure to other viral communicable diseases: Secondary | ICD-10-CM | POA: Diagnosis present

## 2019-05-13 DIAGNOSIS — L03213 Periorbital cellulitis: Secondary | ICD-10-CM

## 2019-05-13 DIAGNOSIS — Z23 Encounter for immunization: Secondary | ICD-10-CM | POA: Diagnosis not present

## 2019-05-13 DIAGNOSIS — H538 Other visual disturbances: Secondary | ICD-10-CM | POA: Diagnosis present

## 2019-05-13 DIAGNOSIS — D649 Anemia, unspecified: Secondary | ICD-10-CM | POA: Diagnosis present

## 2019-05-13 DIAGNOSIS — Z79899 Other long term (current) drug therapy: Secondary | ICD-10-CM | POA: Diagnosis not present

## 2019-05-13 DIAGNOSIS — F319 Bipolar disorder, unspecified: Secondary | ICD-10-CM | POA: Diagnosis present

## 2019-05-13 DIAGNOSIS — L0390 Cellulitis, unspecified: Secondary | ICD-10-CM | POA: Diagnosis present

## 2019-05-13 DIAGNOSIS — F419 Anxiety disorder, unspecified: Secondary | ICD-10-CM | POA: Diagnosis present

## 2019-05-13 DIAGNOSIS — Z6832 Body mass index (BMI) 32.0-32.9, adult: Secondary | ICD-10-CM

## 2019-05-13 LAB — SARS CORONAVIRUS 2 BY RT PCR (HOSPITAL ORDER, PERFORMED IN ~~LOC~~ HOSPITAL LAB): SARS Coronavirus 2: NEGATIVE

## 2019-05-13 LAB — CBC WITH DIFFERENTIAL/PLATELET
Abs Immature Granulocytes: 0.08 K/uL — ABNORMAL HIGH (ref 0.00–0.07)
Basophils Absolute: 0.1 K/uL (ref 0.0–0.1)
Basophils Relative: 1 %
Eosinophils Absolute: 0.4 K/uL (ref 0.0–0.5)
Eosinophils Relative: 4 %
HCT: 38 % (ref 36.0–46.0)
Hemoglobin: 11.1 g/dL — ABNORMAL LOW (ref 12.0–15.0)
Immature Granulocytes: 1 %
Lymphocytes Relative: 20 %
Lymphs Abs: 1.9 K/uL (ref 0.7–4.0)
MCH: 23.8 pg — ABNORMAL LOW (ref 26.0–34.0)
MCHC: 29.2 g/dL — ABNORMAL LOW (ref 30.0–36.0)
MCV: 81.4 fL (ref 80.0–100.0)
Monocytes Absolute: 0.8 K/uL (ref 0.1–1.0)
Monocytes Relative: 9 %
Neutro Abs: 6 K/uL (ref 1.7–7.7)
Neutrophils Relative %: 65 %
Platelets: 290 K/uL (ref 150–400)
RBC: 4.67 MIL/uL (ref 3.87–5.11)
RDW: 17.3 % — ABNORMAL HIGH (ref 11.5–15.5)
WBC: 9.2 K/uL (ref 4.0–10.5)
nRBC: 0 % (ref 0.0–0.2)

## 2019-05-13 LAB — BASIC METABOLIC PANEL
Anion gap: 11 (ref 5–15)
BUN: 10 mg/dL (ref 6–20)
CO2: 26 mmol/L (ref 22–32)
Calcium: 9.3 mg/dL (ref 8.9–10.3)
Chloride: 99 mmol/L (ref 98–111)
Creatinine, Ser: 0.75 mg/dL (ref 0.44–1.00)
GFR calc Af Amer: >60 mL/min (ref >60)
GFR calc non Af Amer: >60 mL/min (ref >60)
Glucose, Bld: 68 mg/dL — ABNORMAL LOW (ref 70–99)
Potassium: 4.1 mmol/L (ref 3.5–5.1)
Sodium: 136 mmol/L (ref 135–145)

## 2019-05-13 LAB — C-REACTIVE PROTEIN: CRP: 0.9 mg/dL

## 2019-05-13 LAB — SEDIMENTATION RATE: Sed Rate: 25 mm/hr — ABNORMAL HIGH (ref 0–22)

## 2019-05-13 MED ORDER — MORPHINE SULFATE (PF) 2 MG/ML IV SOLN: 2.0000 mg | Freq: Once | INTRAVENOUS | Status: DC

## 2019-05-13 MED ORDER — CLINDAMYCIN PHOSPHATE 600 MG/50ML IV SOLN
600.0000 mg | Freq: Three times a day (TID) | INTRAVENOUS | Status: DC
  Administered 2019-05-13 (×2): 600 mg via INTRAVENOUS
  Filled 2019-05-13 (×3): qty 50

## 2019-05-13 MED ORDER — HYDROMORPHONE HCL 1 MG/ML IJ SOLN
1.0000 mg | Freq: Once | INTRAMUSCULAR | Status: AC
  Administered 2019-05-13: 14:00:00 1 mg via INTRAVENOUS
  Filled 2019-05-13: qty 1

## 2019-05-13 MED ORDER — MORPHINE SULFATE (PF) 4 MG/ML IV SOLN
4.0000 mg | Freq: Once | INTRAVENOUS | Status: AC
  Administered 2019-05-13: 12:00:00 4 mg via INTRAVENOUS
  Filled 2019-05-13: qty 1

## 2019-05-13 MED ORDER — HYDROMORPHONE HCL 1 MG/ML IJ SOLN
0.5000 mg | Freq: Once | INTRAMUSCULAR | Status: AC
  Administered 2019-05-13: 0.5 mg via INTRAVENOUS
  Filled 2019-05-13: qty 0.5

## 2019-05-13 MED ORDER — HYDROMORPHONE HCL 1 MG/ML IJ SOLN
1.0000 mg | Freq: Once | INTRAMUSCULAR | Status: AC
  Administered 2019-05-13: 1 mg via INTRAVENOUS
  Filled 2019-05-13: qty 1

## 2019-05-13 MED ORDER — IOHEXOL 300 MG/ML  SOLN
100.0000 mL | Freq: Once | INTRAMUSCULAR | Status: AC | PRN
  Administered 2019-05-13: 80 mL via INTRAVENOUS

## 2019-05-13 NOTE — ED Notes (Signed)
Reports has been called to Levada Dy, Therapist, sports at [facility]

## 2019-05-13 NOTE — ED Provider Notes (Signed)
[facility] EMERGENCY DEPARTMENT Provider Note   CSN: [id number] Arrival date & time: 05/13/19  [id number]     History   Chief Complaint Chief Complaint  Patient presents with  . Facial Swelling    HPI Casey Mitchell is a 38 y.o. female.  Presents emerged from a chief complaint right facial pain, swelling.  Patient states symptoms have been going on for the past few days.  She went to emergency department in [place] 2 days ago.  States that they diagnosed her with facial cellulitis, discharged home with clindamycin.  Patient states that she has been taking this medicine as prescribed, but is still having symptoms.  States right/face is painful, swallowing.  She denies loss of vision.  States that she has a prior history of lupus but is currently not on any medication for this.   Obtained additional history from care everywhere, chart review - [facility] N[facility] 10/19 dx with facial cellulitis, given oral clinda -CT negative for deeper infection     HPI  Past Medical History:  Diagnosis Date  . Anxiety   . Bipolar affective disorder ([place])   . Depression   . High cholesterol   . Lupus [facility])     Patient Active Problem List   Diagnosis Date Noted  . Bipolar affective disorder [facility])     Past Surgical History:  Procedure Laterality Date  . CESAREAN SECTION    . TONSILLECTOMY    . TUBAL LIGATION       OB History   No obstetric history on file.      Home Medications    Prior to Admission medications   Medication Sig Start Date End Date Taking? Authorizing Provider  cephALEXin (KEFLEX) 500 MG capsule Take 1 capsule (500 mg total) by mouth 4 (four) times daily. 07/29/15   Etta Quill, NP  clonazePAM (KLONOPIN) 1 MG tablet Take 1 mg by mouth 3 (three) times daily.    [provider]  dicyclomine (BENTYL) 20 MG tablet Take 20 mg by mouth 3 (three) times daily before meals.    [provider]  HYDROcodone-acetaminophen (NORCO/VICODIN) 5-325 MG  per tablet Take 1 tablet by mouth every 6 (six) hours as needed for moderate pain.    [provider]  oxyCODONE-acetaminophen (PERCOCET/ROXICET) 5-325 MG tablet Take 1 tablet by mouth every 6 (six) hours as needed for severe pain. 07/29/15   Etta Quill, NP  pantoprazole (PROTONIX) 40 MG tablet Take 1 tablet (40 mg total) by mouth daily. 04/10/19   Carlisle Cater, PA-C  predniSONE (DELTASONE) 20 MG tablet 3 tabs po day one, then 2 tabs daily x 4 days 07/29/15   Etta Quill, NP  promethazine (PHENERGAN) 25 MG tablet Take 1 tablet (25 mg total) by mouth every 6 (six) hours as needed for nausea or vomiting. 04/10/19   Carlisle Cater, PA-C  sucralfate (CARAFATE) 1 GM/10ML suspension Take 10 mLs (1 g total) by mouth 4 (four) times daily -  with meals and at bedtime. 04/10/19   Carlisle Cater, PA-C  sulfamethoxazole-trimethoprim (SEPTRA DS) 800-160 MG per tablet Take 2 tablets by mouth 2 (two) times daily. 10/23/14   Hyman Bible, PA-C    Family History No family history on file.  Social History Social History   Tobacco Use  . Smoking status: Current Every Day Smoker  . Smokeless tobacco: Current User  Substance Use Topics  . Alcohol use: No  . Drug use: No     Allergies   Aripiprazole and Olanzapine  Review of Systems Review of Systems  Constitutional: Positive for chills. Negative for fever.  HENT: Negative for ear pain and sore throat.   Eyes: Positive for pain. Negative for visual disturbance.  Respiratory: Negative for cough and shortness of breath.   Cardiovascular: Negative for chest pain and palpitations.  Gastrointestinal: Negative for abdominal pain and vomiting.  Genitourinary: Negative for dysuria and hematuria.  Musculoskeletal: Negative for arthralgias and back pain.  Skin: Negative for color change and rash.  Neurological: Negative for seizures and syncope.  All other systems reviewed and are negative.    Physical Exam Updated Vital Signs BP 109/63 (BP  Location: Right Arm)   Pulse 70   Temp 98.1 F (36.7 C)   Resp 18   Ht 5\' 4"  (1.626 m)   Wt 85.3 kg   LMP 04/23/2019   SpO2 100%   BMI 32.27 kg/m   Physical Exam Vitals signs and nursing note reviewed.  Constitutional:      General: She is not in acute distress.    Appearance: She is well-developed.  HENT:     Head: Normocephalic and atraumatic.     Comments: Generalized erythema over right orbit, swelling of upper and lower eyelid, eye is without proptosis, pupil is briskly reactive, equal, EOM intact Eyes:     Extraocular Movements: Extraocular movements intact.     Conjunctiva/sclera: Conjunctivae normal.     Pupils: Pupils are equal, round, and reactive to light.     Comments: See above  Neck:     Musculoskeletal: Neck supple.  Cardiovascular:     Rate and Rhythm: Normal rate and regular rhythm.     Heart sounds: No murmur.  Pulmonary:     Effort: Pulmonary effort is normal. No respiratory distress.     Breath sounds: Normal breath sounds.  Abdominal:     Palpations: Abdomen is soft.     Tenderness: There is no abdominal tenderness.  Skin:    General: Skin is warm and dry.  Neurological:     General: No focal deficit present.     Mental Status: She is alert.  Psychiatric:        Mood and Affect: Mood normal.        Behavior: Behavior normal.      ED Treatments / Results  Labs (all labs ordered are listed, but only abnormal results are displayed) Labs Reviewed  CBC WITH DIFFERENTIAL/PLATELET - Abnormal; Notable for the following components:      Result Value   Hemoglobin 11.1 (*)    MCH 23.8 (*)    MCHC 29.2 (*)    RDW 17.3 (*)    Abs Immature Granulocytes 0.08 (*)    All other components within normal limits  BASIC METABOLIC PANEL - Abnormal; Notable for the following components:   Glucose, Bld 68 (*)    All other components within normal limits  CULTURE, BLOOD (ROUTINE X 2)  CULTURE, BLOOD (ROUTINE X 2)  SARS CORONAVIRUS 2 (TAT 6-24 HRS)   SEDIMENTATION RATE  C-REACTIVE PROTEIN    EKG None  Radiology Ct Maxillofacial W Contrast  Result Date: 05/13/2019 CLINICAL DATA:  Redness and swelling over the right orbit with concern for facial cellulitis EXAM: CT MAXILLOFACIAL WITH CONTRAST TECHNIQUE: Multidetector CT imaging of the maxillofacial structures was performed with intravenous contrast. Multiplanar CT image reconstructions were also generated. CONTRAST:  68mL OMNIPAQUE IOHEXOL 300 MG/ML  SOLN COMPARISON:  Head CT 03/29/2018 FINDINGS: Osseous: Negative for fracture or destructive process. Missing upper incisors  and left canine which were likely extracted recently. Orbits: Right preseptal swelling that is mild in centered on the lids. No postseptal edema or abscess. Superior ophthalmic veins are enhancing. Unremarkable appearance of the globes, extraocular muscles, and lacrimal glands. Sinuses: Clear Soft tissues: As above Limited intracranial: Negative IMPRESSION: Right preseptal swelling.  No postseptal inflammation or abscess. Electronically Signed   By: Monte Fantasia M.D.   On: 05/13/2019 11:27    Procedures Procedures (including critical care time)  Medications Ordered in ED Medications  clindamycin (CLEOCIN) IVPB 600 mg (has no administration in time range)  iohexol (OMNIPAQUE) 300 MG/ML solution 100 mL (80 mLs Intravenous Contrast Given 05/13/19 1103)     Initial Impression / Assessment and Plan / ED Course  I have reviewed the triage vital signs and the nursing notes.  Pertinent labs & imaging results that were available during my care of the patient were reviewed by me and considered in my medical decision making (see chart for details).  Clinical Course as of May 12 1157  Wed May 13, 2019  1123 Reviewed CT, will await final read  CT Maxillofacial W Contrast [RD]    Clinical Course User Index [RD] Lucrezia Starch, MD       38 year old lady presents to ER with right facial, periorbital swelling,  redness.  Concern for facial cellulitis, preseptal cellulitis.  No leukocytosis, patient not septic appearing otherwise.  CT scan without evidence for deeper infection, no evidence for orbital cellulitis or abscess.  EOM intact.  Suspect facial cellulitis, preseptal cellulitis.  Will start on IV antibiotics, obtain blood cultures and admit to hospitalist service.  Dr. Doristine Bosworth accepting physician at [facility].  Final Clinical Impressions(s) / ED Diagnoses   Final diagnoses:  Facial cellulitis  Preseptal cellulitis of right eye    ED Discharge Orders    None       Lucrezia Starch, MD 05/13/19 1300

## 2019-05-13 NOTE — ED Triage Notes (Signed)
Pt reports facial swelling started 3 days ago, painful right eye swelling , eye drainage. No facial drooping nor extremities swelling. unable to open right eye. denies further symptoms. denies teeth issues . alert and oriented x 4.

## 2019-05-13 NOTE — ED Notes (Signed)
magic mouthwash, oral diflucan & topical steroids prescribed by UC gave no relief. Has upcoming ENT appt. States mouth sores are unbearable.

## 2019-05-14 ENCOUNTER — Encounter (HOSPITAL_COMMUNITY): Payer: Self-pay | Admitting: Internal Medicine

## 2019-05-14 DIAGNOSIS — L0390 Cellulitis, unspecified: Secondary | ICD-10-CM | POA: Diagnosis present

## 2019-05-14 DIAGNOSIS — L03211 Cellulitis of face: Principal | ICD-10-CM

## 2019-05-14 LAB — CBC WITH DIFFERENTIAL/PLATELET
Abs Immature Granulocytes: 0.06 10*3/uL (ref 0.00–0.07)
Basophils Absolute: 0.1 10*3/uL (ref 0.0–0.1)
Basophils Relative: 1 %
Eosinophils Absolute: 0.3 10*3/uL (ref 0.0–0.5)
Eosinophils Relative: 4 %
HCT: 34.8 % — ABNORMAL LOW (ref 36.0–46.0)
Hemoglobin: 10.8 g/dL — ABNORMAL LOW (ref 12.0–15.0)
Immature Granulocytes: 1 %
Lymphocytes Relative: 22 %
Lymphs Abs: 1.5 10*3/uL (ref 0.7–4.0)
MCH: 24.4 pg — ABNORMAL LOW (ref 26.0–34.0)
MCHC: 31 g/dL (ref 30.0–36.0)
MCV: 78.7 fL — ABNORMAL LOW (ref 80.0–100.0)
Monocytes Absolute: 0.7 10*3/uL (ref 0.1–1.0)
Monocytes Relative: 9 %
Neutro Abs: 4.4 10*3/uL (ref 1.7–7.7)
Neutrophils Relative %: 63 %
Platelets: 245 10*3/uL (ref 150–400)
RBC: 4.42 MIL/uL (ref 3.87–5.11)
RDW: 17.4 % — ABNORMAL HIGH (ref 11.5–15.5)
WBC: 7 10*3/uL (ref 4.0–10.5)
nRBC: 0 % (ref 0.0–0.2)

## 2019-05-14 LAB — RAPID URINE DRUG SCREEN, HOSP PERFORMED
Amphetamines: NOT DETECTED
Barbiturates: NOT DETECTED
Benzodiazepines: POSITIVE — AB
Cocaine: NOT DETECTED
Opiates: POSITIVE — AB
Tetrahydrocannabinol: POSITIVE — AB

## 2019-05-14 LAB — COMPREHENSIVE METABOLIC PANEL
ALT: 25 U/L (ref 0–44)
AST: 32 U/L (ref 15–41)
Albumin: 3.4 g/dL — ABNORMAL LOW (ref 3.5–5.0)
Alkaline Phosphatase: 81 U/L (ref 38–126)
Anion gap: 12 (ref 5–15)
BUN: 9 mg/dL (ref 6–20)
CO2: 22 mmol/L (ref 22–32)
Calcium: 8.8 mg/dL — ABNORMAL LOW (ref 8.9–10.3)
Chloride: 101 mmol/L (ref 98–111)
Creatinine, Ser: 0.85 mg/dL (ref 0.44–1.00)
GFR calc Af Amer: >60 mL/min (ref >60)
GFR calc non Af Amer: >60 mL/min (ref >60)
Glucose, Bld: 81 mg/dL (ref 70–99)
Potassium: 4.3 mmol/L (ref 3.5–5.1)
Sodium: 135 mmol/L (ref 135–145)
Total Bilirubin: 0.4 mg/dL (ref 0.3–1.2)
Total Protein: 6.5 g/dL (ref 6.5–8.1)

## 2019-05-14 LAB — HIV ANTIBODY (ROUTINE TESTING W REFLEX): HIV Screen 4th Generation wRfx: NONREACTIVE

## 2019-05-14 LAB — PREGNANCY, URINE: Preg Test, Ur: NEGATIVE

## 2019-05-14 MED ORDER — POLYETHYLENE GLYCOL 3350 17 G PO PACK
17.0000 g | PACK | Freq: Every day | ORAL | Status: DC
  Filled 2019-05-14 (×4): qty 1

## 2019-05-14 MED ORDER — ONDANSETRON HCL 4 MG/2ML IJ SOLN
4.0000 mg | Freq: Four times a day (QID) | INTRAMUSCULAR | Status: DC | PRN
  Administered 2019-05-14 – 2019-05-15 (×3): 4 mg via INTRAVENOUS
  Filled 2019-05-14 (×3): qty 2

## 2019-05-14 MED ORDER — NICOTINE 21 MG/24HR TD PT24
21.0000 mg | MEDICATED_PATCH | Freq: Every day | TRANSDERMAL | Status: DC
  Administered 2019-05-14 – 2019-05-17 (×4): 21 mg via TRANSDERMAL
  Filled 2019-05-14 (×4): qty 1

## 2019-05-14 MED ORDER — HYDROCODONE-ACETAMINOPHEN 5-325 MG PO TABS
1.0000 | ORAL_TABLET | Freq: Four times a day (QID) | ORAL | Status: DC | PRN
  Administered 2019-05-14 – 2019-05-17 (×11): 1 via ORAL
  Filled 2019-05-14 (×11): qty 1

## 2019-05-14 MED ORDER — ENOXAPARIN SODIUM 40 MG/0.4ML ~~LOC~~ SOLN
40.0000 mg | SUBCUTANEOUS | Status: DC
  Administered 2019-05-14 – 2019-05-16 (×3): 40 mg via SUBCUTANEOUS
  Filled 2019-05-14 (×3): qty 0.4

## 2019-05-14 MED ORDER — PIPERACILLIN-TAZOBACTAM 3.375 G IVPB
3.3750 g | Freq: Three times a day (TID) | INTRAVENOUS | Status: DC
  Administered 2019-05-14 – 2019-05-17 (×11): 3.375 g via INTRAVENOUS
  Filled 2019-05-14 (×12): qty 50

## 2019-05-14 MED ORDER — ACETAMINOPHEN 650 MG RE SUPP: 650.0000 mg | Freq: Four times a day (QID) | RECTAL | Status: DC | PRN

## 2019-05-14 MED ORDER — METHADONE HCL 10 MG PO TABS
220.0000 mg | ORAL_TABLET | Freq: Every day | ORAL | Status: DC
  Administered 2019-05-14 – 2019-05-15 (×2): 220 mg via ORAL
  Filled 2019-05-14 (×3): qty 22

## 2019-05-14 MED ORDER — VANCOMYCIN HCL IN DEXTROSE 1-5 GM/200ML-% IV SOLN
1000.0000 mg | Freq: Two times a day (BID) | INTRAVENOUS | Status: DC
  Administered 2019-05-14 – 2019-05-15 (×4): 1000 mg via INTRAVENOUS
  Filled 2019-05-14 (×4): qty 200

## 2019-05-14 MED ORDER — ONDANSETRON HCL 4 MG PO TABS: 4.0000 mg | ORAL_TABLET | Freq: Four times a day (QID) | ORAL | Status: DC | PRN

## 2019-05-14 MED ORDER — ACETAMINOPHEN 325 MG PO TABS: 650.0000 mg | ORAL_TABLET | Freq: Four times a day (QID) | ORAL | Status: DC | PRN

## 2019-05-14 NOTE — Progress Notes (Signed)
Patient visits methadone clinic. She has not had her dose this morning. Dr. Tyrell Antonio notified. Nurse will continue to monitor. [place]

## 2019-05-14 NOTE — Progress Notes (Signed)
PROGRESS NOTE    Casey Mitchell  [id number] DOB: 05-29-1981 DOA: 05/13/2019 PCP: Patient, No Pcp Per   Brief Narrative: 38 year old with past medical history significant for lupus not on any medication chronic pain on methadone program who presents complaining of worsening right-sided facial swelling and right thigh swelling for over 3 to 4 days.  She was evaluated at [facility] and was prescribed clindamycin, she took them antibiotics for 3 days with no significant improvement.  She presented with worsening symptoms. Evaluation in the ED she had a CT maxillofacial done showed right preseptal swelling with no post septal involvement.     Assessment & Plan:   Principal Problem:   Cellulitis, face Active Problems:   Cellulitis  1-cellulitis of the right side of the face, preseptal: She was restarted on IV vancomycin and Zosyn, she failed oral antibiotics as an outpatient. Follow blood cultures.  2-history of lupus: Not on medication  3-chronic pain; resume methadone.  Check EKG to monitor QTC..   Estimated body mass index is 32.24 kg/m as calculated from the following:   Height as of this encounter: 5\' 4"  (1.626 m).   Weight as of this encounter: 85.2 kg.   DVT prophylaxis: Lovenox Code Status: Full code Family Communication: Care discussed with patient Disposition Plan: Remain in the hospital for IV antibiotics, failed oral antibiotics Consultants:   None  Procedures:   none  Antimicrobials:  Vancomycin, Zosyn  Subjective: Alert, report swelling face persist, feels swelling is spreading left side.   Objective: Vitals:   05/13/19 2100 05/13/19 2149 05/13/19 2314 05/14/19 0344  BP: 116/78 118/70 116/75 120/81  Pulse: 74 69 71 77  Resp: 16 18 18 16   Temp:  98.6 F (37 C) 98.5 F (36.9 C) 98.3 F (36.8 C)  TempSrc:  Oral Oral Oral  SpO2: 99% 100% 100% 100%  Weight:  85.2 kg    Height:  5\' 4"  (1.626 m)      Intake/Output Summary (Last 24  hours) at 05/14/2019 0751 Last data filed at 05/14/2019 0500 Gross per 24 hour  Intake 348.56 ml  Output -  Net 348.56 ml   Filed Weights   05/13/19 0850 05/13/19 2149  Weight: 85.3 kg 85.2 kg    Examination:  General exam: Appears calm and comfortable  Respiratory system: Clear to auscultation. Respiratory effort normal. Cardiovascular system: S1 & S2 heard, RRR. No JVD, murmurs, rubs, gallops or clicks. No pedal edema. Gastrointestinal system: Abdomen is nondistended, soft and nontender. No organomegaly or masses felt. Normal bowel sounds heard. Central nervous system: Alert and oriented. No focal neurological deficits. Extremities: Symmetric 5 x 5 power. Skin: right face swelling, small pimple, wound near right eye.     Data Reviewed: I have personally reviewed following labs and imaging studies  CBC: Recent Labs  Lab 05/13/19 1021 05/14/19 0358  WBC 9.2 7.0  NEUTROABS 6.0 4.4  HGB 11.1* 10.8*  HCT 38.0 34.8*  MCV 81.4 78.7*  PLT 290 [id number]   Basic Metabolic Panel: Recent Labs  Lab 05/13/19 1021 05/14/19 0358  NA 136 135  K 4.1 4.3  CL 99 101  CO2 26 22  GLUCOSE 68* 81  BUN 10 9  CREATININE 0.75 0.85  CALCIUM 9.3 8.8*   GFR: Estimated Creatinine Clearance: 93.8 mL/min (by C-G formula based on SCr of 0.85 mg/dL). Liver Function Tests: Recent Labs  Lab 05/14/19 0358  AST 32  ALT 25  ALKPHOS 81  BILITOT 0.4  PROT 6.5  ALBUMIN 3.4*  No results for input(s): LIPASE, AMYLASE in the last 168 hours. No results for input(s): AMMONIA in the last 168 hours. Coagulation Profile: No results for input(s): INR, PROTIME in the last 168 hours. Cardiac Enzymes: No results for input(s): CKTOTAL, CKMB, CKMBINDEX, TROPONINI in the last 168 hours. BNP (last 3 results) No results for input(s): PROBNP in the last 8760 hours. HbA1C: No results for input(s): HGBA1C in the last 72 hours. CBG: No results for input(s): GLUCAP in the last 168 hours. Lipid Profile: No  results for input(s): CHOL, HDL, LDLCALC, TRIG, CHOLHDL, LDLDIRECT in the last 72 hours. Thyroid Function Tests: No results for input(s): TSH, T4TOTAL, FREET4, T3FREE, THYROIDAB in the last 72 hours. Anemia Panel: No results for input(s): VITAMINB12, FOLATE, FERRITIN, TIBC, IRON, RETICCTPCT in the last 72 hours. Sepsis Labs: No results for input(s): PROCALCITON, LATICACIDVEN in the last 168 hours.  Recent Results (from the past 240 hour(s))  Blood culture (routine x 2)     Status: None (Preliminary result)   Collection Time: 05/13/19 10:20 AM   Specimen: BLOOD RIGHT ARM  Result Value Ref Range Status   Specimen Description   Final    BLOOD RIGHT ARM Performed at [facility], [address], [place], [place] [address]    Special Requests   Final    BOTTLES DRAWN AEROBIC AND ANAEROBIC Blood Culture results may not be optimal due to an inadequate volume of blood received in culture bottles Performed at [facility], [address] [place], [place] [address]    Culture   Final    NO GROWTH < 24 HOURS Performed at [facility], [place] [address] [place], [place] [address]    Report Status PENDING  Incomplete  Blood culture (routine x 2)     Status: None (Preliminary result)   Collection Time: 05/13/19 10:30 AM   Specimen: BLOOD  Result Value Ref Range Status   Specimen Description   Final    BLOOD RIGHT ANTECUBITAL Performed at [facility], [address] [place], [place] [address]    Special Requests   Final    BOTTLES DRAWN AEROBIC AND ANAEROBIC Blood Culture results may not be optimal due to an inadequate volume of blood received in culture bottles Performed at [facility], [address] [place], [place] [address]    Culture   Final    NO GROWTH < 24 HOURS Performed at [facility], [place] [address], [place], [place] [address]    Report Status PENDING  Incomplete  SARS Coronavirus 2 by RT PCR (hospital order, performed in  [facility] hospital lab) Nasopharyngeal Nasopharyngeal Swab     Status: None   Collection Time: 05/13/19  2:35 PM   Specimen: Nasopharyngeal Swab  Result Value Ref Range Status   SARS Coronavirus 2 NEGATIVE NEGATIVE Final    Comment: (NOTE) If result is NEGATIVE SARS-CoV-2 target nucleic acids are NOT DETECTED. The SARS-CoV-2 RNA is generally detectable in upper and lower  respiratory specimens during the acute phase of infection. The lowest  concentration of SARS-CoV-2 viral copies this assay can detect is 250  copies / mL. A negative result does not preclude SARS-CoV-2 infection  and should not be used as the sole basis for treatment or other  patient management decisions.  A negative result may occur with  improper specimen collection / handling, submission of specimen other  than nasopharyngeal swab, presence of viral mutation(s) within  the  areas targeted by this assay, and inadequate number of viral copies  (<250 copies / mL). A negative result must be combined with clinical  observations, patient history, and epidemiological information. If result is POSITIVE SARS-CoV-2 target nucleic acids are DETECTED. The SARS-CoV-2 RNA is generally detectable in upper and lower  respiratory specimens dur ing the acute phase of infection.  Positive  results are indicative of active infection with SARS-CoV-2.  Clinical  correlation with patient history and other diagnostic information is  necessary to determine patient infection status.  Positive results do  not rule out bacterial infection or co-infection with other viruses. If result is PRESUMPTIVE POSTIVE SARS-CoV-2 nucleic acids MAY BE PRESENT.   A presumptive positive result was obtained on the submitted specimen  and confirmed on repeat testing.  While 2019 novel coronavirus  (SARS-CoV-2) nucleic acids may be present in the submitted sample  additional confirmatory testing may be necessary for epidemiological  and / or clinical  management purposes  to differentiate between  SARS-CoV-2 and other Sarbecovirus currently known to infect humans.  If clinically indicated additional testing with an alternate test  methodology [phone]) is advised. The SARS-CoV-2 RNA is generally  detectable in upper and lower respiratory sp ecimens during the acute  phase of infection. The expected result is Negative. Fact Sheet for Patients:  StrictlyIdeas.no Fact Sheet for Healthcare Providers: BankingDealers.co.za This test is not yet approved or cleared by the [place] FDA and has been authorized for detection and/or diagnosis of SARS-CoV-2 by FDA under an Emergency Use Authorization (EUA).  This EUA will remain in effect (meaning this test can be used) for the duration of the COVID-19 declaration under Section 564(b)(1) of the Act, 21 U.S.C. section 360bbb-3(b)(1), unless the authorization is terminated or revoked sooner. Performed at [facility], [address] [place], [place] [address]          Radiology Studies: Ct Maxillofacial W Contrast  Result Date: 05/13/2019 CLINICAL DATA:  Redness and swelling over the right orbit with concern for facial cellulitis EXAM: CT MAXILLOFACIAL WITH CONTRAST TECHNIQUE: Multidetector CT imaging of the maxillofacial structures was performed with intravenous contrast. Multiplanar CT image reconstructions were also generated. CONTRAST:  50mL OMNIPAQUE IOHEXOL 300 MG/ML  SOLN COMPARISON:  Head CT 03/29/2018 FINDINGS: Osseous: Negative for fracture or destructive process. Missing upper incisors and left canine which were likely extracted recently. Orbits: Right preseptal swelling that is mild in centered on the lids. No postseptal edema or abscess. Superior ophthalmic veins are enhancing. Unremarkable appearance of the globes, extraocular muscles, and lacrimal glands. Sinuses: Clear Soft tissues: As above Limited intracranial:  Negative IMPRESSION: Right preseptal swelling.  No postseptal inflammation or abscess. Electronically Signed   By: Monte Fantasia M.D.   On: 05/13/2019 11:27        Scheduled Meds: Continuous Infusions: . piperacillin-tazobactam (ZOSYN)  IV 12.5 mL/hr at 05/14/19 0500  . vancomycin 200 mL/hr at 05/14/19 0500     LOS: 1 day    Time spent: 35 minutes.     Elmarie Shiley, MD [facility] Pager [phone]  If 7PM-7AM, please contact night-coverage [email or web address] Password [facility] 05/14/2019, 7:51 AM

## 2019-05-14 NOTE — H&P (Signed)
History and Physical    Casey Mitchell [id number] DOB: 02-25-1981 DOA: 05/13/2019  PCP: Patient, No Pcp Per  Patient coming from: Home.  Chief Complaint: Right periorbital pain and swelling.  HPI: Casey Mitchell is a 38 y.o. female with history of lupus as per the patient presently not on any medications and patient states she also is on methadone program which has to be verified in the morning and been having increasing pain and swelling of the right eye knowing the right face and nasal bridge over the last 3 to 4 days.  Patient had gone to ER at [facility] and was prescribed clindamycin which patient was taking for last 3 days despite which the swelling has not improved.  Denies any fever chills or any neck pain or difficulty swallowing.  History patient is able to open her eyes but not completely on the right side.  Given that the patient's symptoms has not improved patient presented to the ER at [facility].  ED Course: In the ER patient was afebrile.  Labs reveal WBC count of 9.2 hemoglobin 11.2 CRP 0.9 creatinine 0.75 platelets 290 sed rate 25.  COVID-19 test was negative.  CT maxillofacial done shows right preseptal swelling with no post septal involvement.  Patient was started on empiric antibiotics after blood cultures obtained and admitted for further observation.  Review of Systems: As per HPI, rest all negative.   Past Medical History:  Diagnosis Date  . Anxiety   . Bipolar affective disorder ([place])   . Depression   . High cholesterol   . Lupus [facility])     Past Surgical History:  Procedure Laterality Date  . CESAREAN SECTION    . TONSILLECTOMY    . TUBAL LIGATION       reports that she has been smoking. She uses smokeless tobacco. She reports that she does not drink alcohol or use drugs.  Allergies  Allergen Reactions  . Aripiprazole Other (See Comments)    Anger Pt states it makes her angry Unknown Pt states it makes her angry Unknown   .  Olanzapine Other (See Comments)    Seizures  Seizures  Seizures      History reviewed. No pertinent family history.  Prior to Admission medications   Medication Sig Start Date End Date Taking? Authorizing Provider  cephALEXin (KEFLEX) 500 MG capsule Take 1 capsule (500 mg total) by mouth 4 (four) times daily. 07/29/15   Etta Quill, NP  clonazePAM (KLONOPIN) 1 MG tablet Take 1 mg by mouth 3 (three) times daily.    [provider]  dicyclomine (BENTYL) 20 MG tablet Take 20 mg by mouth 3 (three) times daily before meals.    [provider]  HYDROcodone-acetaminophen (NORCO/VICODIN) 5-325 MG per tablet Take 1 tablet by mouth every 6 (six) hours as needed for moderate pain.    [provider]  oxyCODONE-acetaminophen (PERCOCET/ROXICET) 5-325 MG tablet Take 1 tablet by mouth every 6 (six) hours as needed for severe pain. 07/29/15   Etta Quill, NP  pantoprazole (PROTONIX) 40 MG tablet Take 1 tablet (40 mg total) by mouth daily. 04/10/19   Carlisle Cater, PA-C  predniSONE (DELTASONE) 20 MG tablet 3 tabs po day one, then 2 tabs daily x 4 days 07/29/15   Etta Quill, NP  promethazine (PHENERGAN) 25 MG tablet Take 1 tablet (25 mg total) by mouth every 6 (six) hours as needed for nausea or vomiting. 04/10/19   Carlisle Cater, PA-C  sucralfate (CARAFATE) 1  GM/10ML suspension Take 10 mLs (1 g total) by mouth 4 (four) times daily -  with meals and at bedtime. 04/10/19   Carlisle Cater, PA-C  sulfamethoxazole-trimethoprim (SEPTRA DS) 800-160 MG per tablet Take 2 tablets by mouth 2 (two) times daily. 10/23/14   Hyman Bible, PA-C    Physical Exam: Constitutional: Moderately built and nourished. Vitals:   05/13/19 1900 05/13/19 2100 05/13/19 2149 05/13/19 2314  BP: 128/84 116/78 118/70 116/75  Pulse: 74 74 69 71  Resp: 18 16 18 18   Temp:   98.6 F (37 C) 98.5 F (36.9 C)  TempSrc:   Oral Oral  SpO2: 99% 99% 100% 100%  Weight:   85.2 kg   Height:   5\' 4"  (1.626 m)    Eyes:  Right periorbital swelling with patient able to partially open the eyelid and able to see.  No restriction of globe movement. ENMT: Right periorbital and facial erythema and swelling involving the nasal bridge. Neck: No neck rigidity. Respiratory: No rhonchi or crepitations. Cardiovascular: S1-S2 heard. Abdomen: Soft nontender bowel sounds present. Musculoskeletal: No edema. Skin: Erythema involving the right face. Neurologic: Alert awake oriented to time place and person.  Moves all extremities. Psychiatric: Appears normal.   Labs on Admission: I have personally reviewed following labs and imaging studies  CBC: Recent Labs  Lab 05/13/19 1021  WBC 9.2  NEUTROABS 6.0  HGB 11.1*  HCT 38.0  MCV 81.4  PLT [id number]   Basic Metabolic Panel: Recent Labs  Lab 05/13/19 1021  NA 136  K 4.1  CL 99  CO2 26  GLUCOSE 68*  BUN 10  CREATININE 0.75  CALCIUM 9.3   GFR: Estimated Creatinine Clearance: 99.7 mL/min (by C-G formula based on SCr of 0.75 mg/dL). Liver Function Tests: No results for input(s): AST, ALT, ALKPHOS, BILITOT, PROT, ALBUMIN in the last 168 hours. No results for input(s): LIPASE, AMYLASE in the last 168 hours. No results for input(s): AMMONIA in the last 168 hours. Coagulation Profile: No results for input(s): INR, PROTIME in the last 168 hours. Cardiac Enzymes: No results for input(s): CKTOTAL, CKMB, CKMBINDEX, TROPONINI in the last 168 hours. BNP (last 3 results) No results for input(s): PROBNP in the last 8760 hours. HbA1C: No results for input(s): HGBA1C in the last 72 hours. CBG: No results for input(s): GLUCAP in the last 168 hours. Lipid Profile: No results for input(s): CHOL, HDL, LDLCALC, TRIG, CHOLHDL, LDLDIRECT in the last 72 hours. Thyroid Function Tests: No results for input(s): TSH, T4TOTAL, FREET4, T3FREE, THYROIDAB in the last 72 hours. Anemia Panel: No results for input(s): VITAMINB12, FOLATE, FERRITIN, TIBC, IRON, RETICCTPCT in the last 72  hours. Urine analysis:    Component Value Date/Time   COLORURINE YELLOW 07/29/2015 1507   APPEARANCEUR CLOUDY (A) 07/29/2015 1507   LABSPEC 1.018 07/29/2015 1507   PHURINE 6.5 07/29/2015 1507   GLUCOSEU NEGATIVE 07/29/2015 1507   HGBUR TRACE (A) 07/29/2015 1507   BILIRUBINUR NEGATIVE 07/29/2015 1507   KETONESUR NEGATIVE 07/29/2015 1507   PROTEINUR NEGATIVE 07/29/2015 1507   NITRITE NEGATIVE 07/29/2015 1507   LEUKOCYTESUR SMALL (A) 07/29/2015 1507   Sepsis Labs: @LABRCNTIP (procalcitonin:4,lacticidven:4) ) Recent Results (from the past 240 hour(s))  Blood culture (routine x 2)     Status: None (Preliminary result)   Collection Time: 05/13/19 10:20 AM   Specimen: BLOOD RIGHT ARM  Result Value Ref Range Status   Specimen Description   Final    BLOOD RIGHT ARM Performed at [facility], [place]  [address] [place], [place] [address]    Special Requests   Final    BOTTLES DRAWN AEROBIC AND ANAEROBIC Blood Culture results may not be optimal due to an inadequate volume of blood received in culture bottles Performed at [facility], [address] [place], [place] [address]    Culture PENDING  Incomplete   Report Status PENDING  Incomplete  SARS Coronavirus 2 by RT PCR (hospital order, performed in [facility] hospital lab) Nasopharyngeal Nasopharyngeal Swab     Status: None   Collection Time: 05/13/19  2:35 PM   Specimen: Nasopharyngeal Swab  Result Value Ref Range Status   SARS Coronavirus 2 NEGATIVE NEGATIVE Final    Comment: (NOTE) If result is NEGATIVE SARS-CoV-2 target nucleic acids are NOT DETECTED. The SARS-CoV-2 RNA is generally detectable in upper and lower  respiratory specimens during the acute phase of infection. The lowest  concentration of SARS-CoV-2 viral copies this assay can detect is 250  copies / mL. A negative result does not preclude SARS-CoV-2 infection  and should not be used as the sole basis for treatment or other  patient management  decisions.  A negative result may occur with  improper specimen collection / handling, submission of specimen other  than nasopharyngeal swab, presence of viral mutation(s) within the  areas targeted by this assay, and inadequate number of viral copies  (<250 copies / mL). A negative result must be combined with clinical  observations, patient history, and epidemiological information. If result is POSITIVE SARS-CoV-2 target nucleic acids are DETECTED. The SARS-CoV-2 RNA is generally detectable in upper and lower  respiratory specimens dur ing the acute phase of infection.  Positive  results are indicative of active infection with SARS-CoV-2.  Clinical  correlation with patient history and other diagnostic information is  necessary to determine patient infection status.  Positive results do  not rule out bacterial infection or co-infection with other viruses. If result is PRESUMPTIVE POSTIVE SARS-CoV-2 nucleic acids MAY BE PRESENT.   A presumptive positive result was obtained on the submitted specimen  and confirmed on repeat testing.  While 2019 novel coronavirus  (SARS-CoV-2) nucleic acids may be present in the submitted sample  additional confirmatory testing may be necessary for epidemiological  and / or clinical management purposes  to differentiate between  SARS-CoV-2 and other Sarbecovirus currently known to infect humans.  If clinically indicated additional testing with an alternate test  methodology [phone]) is advised. The SARS-CoV-2 RNA is generally  detectable in upper and lower respiratory sp ecimens during the acute  phase of infection. The expected result is Negative. Fact Sheet for Patients:  StrictlyIdeas.no Fact Sheet for Healthcare Providers: BankingDealers.co.za This test is not yet approved or cleared by the [place] FDA and has been authorized for detection and/or diagnosis of SARS-CoV-2 by FDA under an  Emergency Use Authorization (EUA).  This EUA will remain in effect (meaning this test can be used) for the duration of the COVID-19 declaration under Section 564(b)(1) of the Act, 21 U.S.C. section 360bbb-3(b)(1), unless the authorization is terminated or revoked sooner. Performed at [facility], [address] [place], [place] [address]      Radiological Exams on Admission: Ct Maxillofacial W Contrast  Result Date: 05/13/2019 CLINICAL DATA:  Redness and swelling over the right orbit with concern for facial cellulitis EXAM: CT MAXILLOFACIAL WITH CONTRAST TECHNIQUE: Multidetector CT imaging of the maxillofacial structures was performed with intravenous contrast. Multiplanar CT image reconstructions were also  generated. CONTRAST:  18mL OMNIPAQUE IOHEXOL 300 MG/ML  SOLN COMPARISON:  Head CT 03/29/2018 FINDINGS: Osseous: Negative for fracture or destructive process. Missing upper incisors and left canine which were likely extracted recently. Orbits: Right preseptal swelling that is mild in centered on the lids. No postseptal edema or abscess. Superior ophthalmic veins are enhancing. Unremarkable appearance of the globes, extraocular muscles, and lacrimal glands. Sinuses: Clear Soft tissues: As above Limited intracranial: Negative IMPRESSION: Right preseptal swelling.  No postseptal inflammation or abscess. Electronically Signed   By: Monte Fantasia M.D.   On: 05/13/2019 11:27     Assessment/Plan Principal Problem:   Cellulitis, face Active Problems:   Cellulitis    1. Cellulitis of the right face preseptal -we will keep patient on vancomycin and Zosyn since patient was taking clindamycin as outpatient we will hold off.  Follow cultures. 2. History of lupus presently not on any medications. 3. Normocytic normochromic anemia follow CBC.  Patient states she takes methadone which has to be verified in the morning.  Urine drug screen is pending.  Pregnancy screen is pending.    DVT prophylaxis: SCDs anticipation of in case patient needs procedure. Code Status: Full code. Family Communication: Discussed with patient. Disposition Plan: Home. Consults called: None. Admission status: Inpatient.   Rise Patience MD [facility] Pager [phone].  If 7PM-7AM, please contact night-coverage [email or web address] Password [email or web address]  05/14/2019, 1:51 AM

## 2019-05-14 NOTE — Progress Notes (Signed)
Pharmacy Antibiotic Note  Casey Mitchell is a 38 y.o. female admitted on 05/13/2019 with facial cellulitis.  Pharmacy has been consulted for Vancomycin/Zosyn dosing. Failed outpatient PO Clindamycin. WBC WNL. Renal function good.   Plan: Vancomycin 1000 mg IV q12h >>Estimated AUC: 508 Zosyn 3.375G IV q8h to be infused over 4 hours Trend WBC, temp, renal function  F/U infectious work-up Drug levels as indicated   Height: 5\' 4"  (162.6 cm) Weight: 187 lb 13.3 oz (85.2 kg) IBW/kg (Calculated) : 54.7  Temp (24hrs), Avg:98.3 F (36.8 C), Min:98.1 F (36.7 C), Max:98.6 F (37 C)  Recent Labs  Lab 05/13/19 1021  WBC 9.2  CREATININE 0.75    Estimated Creatinine Clearance: 99.7 mL/min (by C-G formula based on SCr of 0.75 mg/dL).    Allergies  Allergen Reactions  . Aripiprazole Other (See Comments)    Anger Pt states it makes her angry Unknown Pt states it makes her angry Unknown   . Olanzapine Other (See Comments)    Seizures  Seizures  Seizures     Narda Bonds, PharmD, BCPS Clinical Pharmacist Phone: [phone]

## 2019-05-15 MED ORDER — VANCOMYCIN HCL IN DEXTROSE 750-5 MG/150ML-% IV SOLN
750.0000 mg | Freq: Two times a day (BID) | INTRAVENOUS | Status: DC
  Administered 2019-05-15 – 2019-05-16 (×3): 750 mg via INTRAVENOUS
  Filled 2019-05-15 (×4): qty 150

## 2019-05-15 NOTE — Plan of Care (Signed)
  Problem: Education: Goal: Knowledge of General Education information will improve Description: Including pain rating scale, medication(s)/side effects and non-pharmacologic comfort measures Outcome: Progressing   Problem: Health Behavior/Discharge Planning: Goal: Ability to manage health-related needs will improve Outcome: Progressing   Problem: Clinical Measurements: Goal: Ability to maintain clinical measurements within normal limits will improve Outcome: Progressing Goal: Diagnostic test results will improve Outcome: Progressing   Problem: Activity: Goal: Risk for activity intolerance will decrease Outcome: Progressing   Problem: Coping: Goal: Level of anxiety will decrease Outcome: Progressing   Problem: Pain Managment: Goal: General experience of comfort will improve Outcome: Progressing   Problem: Safety: Goal: Ability to remain free from injury will improve Outcome: Progressing   Problem: Skin Integrity: Goal: Risk for impaired skin integrity will decrease Outcome: Progressing   Ival Bible, BSN, RN

## 2019-05-15 NOTE — Progress Notes (Signed)
Casey Mitchell  [id number] DOB: Jul 30, 1980 DOA: 05/13/2019 PCP: Patient, No Pcp Per   Brief Narrative: 38 year old with past medical history significant for lupus not on any medication chronic pain on methadone program who presents complaining of worsening right-sided facial swelling and right thigh swelling for over 3 to 4 days.  She was evaluated at [facility] and was prescribed clindamycin, she took them antibiotics for 3 days with no significant improvement.  She presented with worsening symptoms. Evaluation in the ED she had a CT maxillofacial done showed right preseptal swelling with no post septal involvement.     Assessment & Plan:   Principal Problem:   Cellulitis, face Active Problems:   Cellulitis  1-cellulitis of the right side of the face, preseptal: She was restarted on IV vancomycin and Zosyn, she failed oral antibiotics as an outpatient. Follow blood cultures. No growth to date.  Swelling has decreased slightly. Will continue with IV antibiotics.   2-history of lupus: Not on medication  3-chronic pain; resume methadone. EKG normal QT    Estimated body mass index is 32.24 kg/m as calculated from the following:   Height as of this encounter: 5\' 4"  (1.626 m).   Weight as of this encounter: 85.2 kg.   DVT prophylaxis: Lovenox Code Status: Full code Family Communication: Care discussed with patient Disposition Plan: Remain in the hospital for IV antibiotics, failed oral antibiotics Consultants:   None  Procedures:   none  Antimicrobials:  Vancomycin, Zosyn  Subjective: Swelling left side face has decrease slightly. Still with redness and edematous.   Objective: Vitals:   05/14/19 2303 05/15/19 0004 05/15/19 0409 05/15/19 0723  BP: (!) 108/51 (!) 97/52 120/71 102/67  Pulse: (!) 57 (!) 53 (!) 57 (!) 56  Resp:  18 17 17   Temp:  98.1 F (36.7 C) (!) 97.4 F (36.3 C) 98 F (36.7 C)  TempSrc:  Oral Oral Oral  SpO2:   100% 95% 97%  Weight:      Height:        Intake/Output Summary (Last 24 hours) at 05/15/2019 1132 Last data filed at 05/15/2019 [id number] Gross per 24 hour  Intake 709.78 ml  Output -  Net 709.78 ml   Filed Weights   05/13/19 0850 05/13/19 2149  Weight: 85.3 kg 85.2 kg    Examination:  General exam: NAD Respiratory system: CTA Cardiovascular system: S 1, S 2 RRR Gastrointestinal system: BS present, soft nt Central nervous system: non focal.  Extremities: Symmetric power.  Skin: right facial swelling has decrease but still edematous and redness    Data Reviewed: I have personally reviewed following labs and imaging studies  CBC: Recent Labs  Lab 05/13/19 1021 05/14/19 0358  WBC 9.2 7.0  NEUTROABS 6.0 4.4  HGB 11.1* 10.8*  HCT 38.0 34.8*  MCV 81.4 78.7*  PLT 290 [id number]   Basic Metabolic Panel: Recent Labs  Lab 05/13/19 1021 05/14/19 0358  NA 136 135  K 4.1 4.3  CL 99 101  CO2 26 22  GLUCOSE 68* 81  BUN 10 9  CREATININE 0.75 0.85  CALCIUM 9.3 8.8*   GFR: Estimated Creatinine Clearance: 93.8 mL/min (by C-G formula based on SCr of 0.85 mg/dL). Liver Function Tests: Recent Labs  Lab 05/14/19 0358  AST 32  ALT 25  ALKPHOS 81  BILITOT 0.4  PROT 6.5  ALBUMIN 3.4*   No results for input(s): LIPASE, AMYLASE in the last 168 hours. No results for input(s): AMMONIA in  the last 168 hours. Coagulation Profile: No results for input(s): INR, PROTIME in the last 168 hours. Cardiac Enzymes: No results for input(s): CKTOTAL, CKMB, CKMBINDEX, TROPONINI in the last 168 hours. BNP (last 3 results) No results for input(s): PROBNP in the last 8760 hours. HbA1C: No results for input(s): HGBA1C in the last 72 hours. CBG: No results for input(s): GLUCAP in the last 168 hours. Lipid Profile: No results for input(s): CHOL, HDL, LDLCALC, TRIG, CHOLHDL, LDLDIRECT in the last 72 hours. Thyroid Function Tests: No results for input(s): TSH, T4TOTAL, FREET4, T3FREE, THYROIDAB  in the last 72 hours. Anemia Panel: No results for input(s): VITAMINB12, FOLATE, FERRITIN, TIBC, IRON, RETICCTPCT in the last 72 hours. Sepsis Labs: No results for input(s): PROCALCITON, LATICACIDVEN in the last 168 hours.  Recent Results (from the past 240 hour(s))  Blood culture (routine x 2)     Status: None (Preliminary result)   Collection Time: 05/13/19 10:20 AM   Specimen: BLOOD RIGHT ARM  Result Value Ref Range Status   Specimen Description   Final    BLOOD RIGHT ARM Performed at [facility], [address], [place], [place] [address]    Special Requests   Final    BOTTLES DRAWN AEROBIC AND ANAEROBIC Blood Culture results may not be optimal due to an inadequate volume of blood received in culture bottles Performed at [facility], [address] [place], [place] [address]    Culture   Final    NO GROWTH 2 DAYS Performed at [facility], [place] [address], [place], [place] [address]    Report Status PENDING  Incomplete  Blood culture (routine x 2)     Status: None (Preliminary result)   Collection Time: 05/13/19 10:30 AM   Specimen: BLOOD  Result Value Ref Range Status   Specimen Description   Final    BLOOD RIGHT ANTECUBITAL Performed at [facility], [address] [place], [place] [address]    Special Requests   Final    BOTTLES DRAWN AEROBIC AND ANAEROBIC Blood Culture results may not be optimal due to an inadequate volume of blood received in culture bottles Performed at [facility], [address] [place], [place] [address]    Culture   Final    NO GROWTH 2 DAYS Performed at [facility], [place] [address], [place], [place] [address]    Report Status PENDING  Incomplete  SARS Coronavirus 2 by RT PCR (hospital order, performed in [facility] hospital lab) Nasopharyngeal Nasopharyngeal Swab     Status: None   Collection Time: 05/13/19  2:35 PM   Specimen: Nasopharyngeal Swab  Result Value Ref Range Status   SARS  Coronavirus 2 NEGATIVE NEGATIVE Final    Comment: (NOTE) If result is NEGATIVE SARS-CoV-2 target nucleic acids are NOT DETECTED. The SARS-CoV-2 RNA is generally detectable in upper and lower  respiratory specimens during the acute phase of infection. The lowest  concentration of SARS-CoV-2 viral copies this assay can detect is 250  copies / mL. A negative result does not preclude SARS-CoV-2 infection  and should not be used as the sole basis for treatment or other  patient management decisions.  A negative result may occur with  improper specimen collection / handling, submission of specimen other  than nasopharyngeal swab, presence of viral mutation(s) within the  areas targeted by this assay, and inadequate number of viral copies  (<250 copies / mL). A  negative result must be combined with clinical  observations, patient history, and epidemiological information. If result is POSITIVE SARS-CoV-2 target nucleic acids are DETECTED. The SARS-CoV-2 RNA is generally detectable in upper and lower  respiratory specimens dur ing the acute phase of infection.  Positive  results are indicative of active infection with SARS-CoV-2.  Clinical  correlation with patient history and other diagnostic information is  necessary to determine patient infection status.  Positive results do  not rule out bacterial infection or co-infection with other viruses. If result is PRESUMPTIVE POSTIVE SARS-CoV-2 nucleic acids MAY BE PRESENT.   A presumptive positive result was obtained on the submitted specimen  and confirmed on repeat testing.  While 2019 novel coronavirus  (SARS-CoV-2) nucleic acids may be present in the submitted sample  additional confirmatory testing may be necessary for epidemiological  and / or clinical management purposes  to differentiate between  SARS-CoV-2 and other Sarbecovirus currently known to infect humans.  If clinically indicated additional testing with an alternate test   methodology [phone]) is advised. The SARS-CoV-2 RNA is generally  detectable in upper and lower respiratory sp ecimens during the acute  phase of infection. The expected result is Negative. Fact Sheet for Patients:  StrictlyIdeas.no Fact Sheet for Healthcare Providers: BankingDealers.co.za This test is not yet approved or cleared by the [place] FDA and has been authorized for detection and/or diagnosis of SARS-CoV-2 by FDA under an Emergency Use Authorization (EUA).  This EUA will remain in effect (meaning this test can be used) for the duration of the COVID-19 declaration under Section 564(b)(1) of the Act, 21 U.S.C. section 360bbb-3(b)(1), unless the authorization is terminated or revoked sooner. Performed at [facility], [address] [place], [place] [address]          Radiology Studies: No results found.      Scheduled Meds: . enoxaparin (LOVENOX) injection  40 mg Subcutaneous Q24H  . methadone  220 mg Oral Daily  . nicotine  21 mg Transdermal Daily  . polyethylene glycol  17 g Oral Daily   Continuous Infusions: . piperacillin-tazobactam (ZOSYN)  IV 3.375 g (05/15/19 0529)  . vancomycin 1,000 mg (05/15/19 0931)     LOS: 2 days    Time spent: 35 minutes.     Elmarie Shiley, MD [facility] Pager [phone]  If 7PM-7AM, please contact night-coverage [email or web address] Password [email or web address] 05/15/2019, 11:32 AM

## 2019-05-15 NOTE — Progress Notes (Signed)
Pharmacy Antibiotic Note  Casey Mitchell is a 38 y.o. female admitted on 05/13/2019 with facial cellulitis.  Pharmacy has been consulted for Vancomycin/Zosyn dosing - day #2. Failed outpatient PO Clindamycin. SCr stable 0.85 (last 10/22). BCx - negative to date.  Plan: Adjust vancomycin to 750 mg IV Q 12 hrs. Goal AUC 400-550. Expected AUC: 517 SCr used: 0.85 Zosyn 3.375g IV q8h (4h infusion) Monitor clinical progress, c/s, renal function F/u de-escalation plan/LOT, vancomycin levels as indicated BMET in AM   Height: 5\' 4"  (162.6 cm) Weight: 187 lb 13.3 oz (85.2 kg) IBW/kg (Calculated) : 54.7  Temp (24hrs), Avg:98.1 F (36.7 C), Min:97.4 F (36.3 C), Max:98.5 F (36.9 C)  Recent Labs  Lab 05/13/19 1021 05/14/19 0358  WBC 9.2 7.0  CREATININE 0.75 0.85    Estimated Creatinine Clearance: 93.8 mL/min (by C-G formula based on SCr of 0.85 mg/dL).    Allergies  Allergen Reactions  . Aripiprazole Other (See Comments)    Anger Pt states it makes her angry Unknown Pt states it makes her angry Unknown   . Olanzapine Other (See Comments)    Seizures  Seizures  Seizures    . Sertraline Nausea And Vomiting and Other (See Comments)    Pt states it makes her ill Unknown Pt states it makes her ill    Elicia Lamp, PharmD, BCPS Please check AMION for all [place] contact numbers Clinical Pharmacist 05/15/2019 11:52 AM

## 2019-05-16 LAB — BASIC METABOLIC PANEL
Anion gap: 11 (ref 5–15)
BUN: 9 mg/dL (ref 6–20)
CO2: 24 mmol/L (ref 22–32)
Calcium: 9 mg/dL (ref 8.9–10.3)
Chloride: 102 mmol/L (ref 98–111)
Creatinine, Ser: 1.15 mg/dL — ABNORMAL HIGH (ref 0.44–1.00)
GFR calc Af Amer: >60 mL/min (ref >60)
GFR calc non Af Amer: 60 mL/min — ABNORMAL LOW (ref >60)
Glucose, Bld: 80 mg/dL (ref 70–99)
Potassium: 4.3 mmol/L (ref 3.5–5.1)
Sodium: 137 mmol/L (ref 135–145)

## 2019-05-16 LAB — MAGNESIUM: Magnesium: 2.2 mg/dL (ref 1.7–2.4)

## 2019-05-16 LAB — TSH: TSH: 2.629 u[IU]/mL (ref 0.350–4.500)

## 2019-05-16 MED ORDER — METHADONE HCL 10 MG PO TABS: 200.0000 mg | ORAL_TABLET | Freq: Every day | ORAL | Status: DC

## 2019-05-16 MED ORDER — INFLUENZA VAC SPLIT QUAD 0.5 ML IM SUSY
0.5000 mL | PREFILLED_SYRINGE | INTRAMUSCULAR | Status: AC
  Administered 2019-05-17: 09:00:00 0.5 mL via INTRAMUSCULAR
  Filled 2019-05-16: qty 0.5

## 2019-05-16 MED ORDER — ONDANSETRON HCL 4 MG/2ML IJ SOLN
4.0000 mg | Freq: Four times a day (QID) | INTRAMUSCULAR | Status: DC | PRN
  Administered 2019-05-16: 22:00:00 4 mg via INTRAVENOUS
  Filled 2019-05-16: qty 2

## 2019-05-16 MED ORDER — METHADONE HCL 10 MG PO TABS
190.0000 mg | ORAL_TABLET | Freq: Every day | ORAL | Status: DC
  Administered 2019-05-16 – 2019-05-17 (×2): 190 mg via ORAL
  Filled 2019-05-16 (×2): qty 19

## 2019-05-16 MED ORDER — NEOMYCIN-POLYMYXIN-DEXAMETH 3.5-10000-0.1 OP OINT
TOPICAL_OINTMENT | Freq: Every day | OPHTHALMIC | Status: DC
  Administered 2019-05-16: 1 via OPHTHALMIC
  Filled 2019-05-16: qty 3.5

## 2019-05-16 NOTE — Progress Notes (Signed)
PROGRESS NOTE    Casey Mitchell  [id number] DOB: 09/13/1980 DOA: 05/13/2019 PCP: Patient, No Pcp Per   Brief Narrative: 38 year old with past medical history significant for lupus not on any medication chronic pain on methadone program who presents complaining of worsening right-sided facial swelling and right thigh swelling for over 3 to 4 days.  She was evaluated at [facility] and was prescribed clindamycin, she took them antibiotics for 3 days with no significant improvement.  She presented with worsening symptoms. Evaluation in the ED she had a CT maxillofacial done showed right preseptal swelling with no post septal involvement.     Assessment & Plan:   Principal Problem:   Cellulitis, face Active Problems:   Cellulitis  1-Cellulitis of the right side of the face, preseptal: She was restarted on IV vancomycin and Zosyn, she failed oral antibiotics as an outpatient. Follow blood cultures. No growth to date.  Swelling improved. Report right eye blurry vision that has improved. Report mild discomfort on eye movement. Discussed with Ophthalmology Dr Brigitte Pulse, start Maxitrol ointment at [facility] and close follow up in the office.  Will proceed with more day IV antibiotics.   2-history of lupus: Not on medication  3-chronic pain; resume methadone. Reduce dose due to bradycardia.  4-Bradycardia; asymptomatic. Check EKG HR in the 50. Check mg level.  Monitor on telemetry. Reduce methadone dose and added holder parameter.    Estimated body mass index is 32.24 kg/m as calculated from the following:   Height as of this encounter: 5\' 4"  (1.626 m).   Weight as of this encounter: 85.2 kg.   DVT prophylaxis: Lovenox Code Status: Full code Family Communication: Care discussed with patient Disposition Plan: Remain in the hospital for IV antibiotics, failed oral antibiotics Consultants:   None  Procedures:   none  Antimicrobials:  Vancomycin, Zosyn  Subjective: Decrease  right side swellin, report blurry vision but improved, mild discomfort on eye movement.  Objective: Vitals:   05/16/19 0338 05/16/19 0741 05/16/19 0834 05/16/19 1013  BP: 99/66 103/78    Pulse: (!) 55 (!) 46 (!) 48 71  Resp: 18 18    Temp: 98 F (36.7 C) 98.2 F (36.8 C)    TempSrc: Oral Oral    SpO2: 100% 96%    Weight:      Height:        Intake/Output Summary (Last 24 hours) at 05/16/2019 1158 Last data filed at 05/16/2019 0345 Gross per 24 hour  Intake 200 ml  Output --  Net 200 ml   Filed Weights   05/13/19 0850 05/13/19 2149  Weight: 85.3 kg 85.2 kg    Examination:  General exam: NAD Respiratory system: CTA Cardiovascular system: S 1, S 2 RRR Gastrointestinal system: BS present, soft, nt Central nervous system: Non focal. Extremities: Symmetric power Skin: right face with less edema    Data Reviewed: I have personally reviewed following labs and imaging studies  CBC: Recent Labs  Lab 05/13/19 1021 05/14/19 0358  WBC 9.2 7.0  NEUTROABS 6.0 4.4  HGB 11.1* 10.8*  HCT 38.0 34.8*  MCV 81.4 78.7*  PLT 290 99991111   Basic Metabolic Panel: Recent Labs  Lab 05/13/19 1021 05/14/19 0358 05/16/19 0322 05/16/19 0955  NA 136 135 137  --   K 4.1 4.3 4.3  --   CL 99 101 102  --   CO2 26 22 24   --   GLUCOSE 68* 81 80  --   BUN 10 9 9   --  CREATININE 0.75 0.85 1.15*  --   CALCIUM 9.3 8.8* 9.0  --   MG  --   --   --  2.2   GFR: Estimated Creatinine Clearance: 69.4 mL/min (A) (by C-G formula based on SCr of 1.15 mg/dL (H)). Liver Function Tests: Recent Labs  Lab 05/14/19 0358  AST 32  ALT 25  ALKPHOS 81  BILITOT 0.4  PROT 6.5  ALBUMIN 3.4*   No results for input(s): LIPASE, AMYLASE in the last 168 hours. No results for input(s): AMMONIA in the last 168 hours. Coagulation Profile: No results for input(s): INR, PROTIME in the last 168 hours. Cardiac Enzymes: No results for input(s): CKTOTAL, CKMB, CKMBINDEX, TROPONINI in the last 168 hours. BNP  (last 3 results) No results for input(s): PROBNP in the last 8760 hours. HbA1C: No results for input(s): HGBA1C in the last 72 hours. CBG: No results for input(s): GLUCAP in the last 168 hours. Lipid Profile: No results for input(s): CHOL, HDL, LDLCALC, TRIG, CHOLHDL, LDLDIRECT in the last 72 hours. Thyroid Function Tests: Recent Labs    05/16/19 0955  TSH 2.629   Anemia Panel: No results for input(s): VITAMINB12, FOLATE, FERRITIN, TIBC, IRON, RETICCTPCT in the last 72 hours. Sepsis Labs: No results for input(s): PROCALCITON, LATICACIDVEN in the last 168 hours.  Recent Results (from the past 240 hour(s))  Blood culture (routine x 2)     Status: None (Preliminary result)   Collection Time: 05/13/19 10:20 AM   Specimen: BLOOD RIGHT ARM  Result Value Ref Range Status   Specimen Description   Final    BLOOD RIGHT ARM Performed at [facility], [address], [place], [place] [address]    Special Requests   Final    BOTTLES DRAWN AEROBIC AND ANAEROBIC Blood Culture results may not be optimal due to an inadequate volume of blood received in culture bottles Performed at [facility], [address] [place], [place] [address]    Culture   Final    NO GROWTH 3 DAYS Performed at [facility], [place] [address] [place], [place] [address]    Report Status PENDING  Incomplete  Blood culture (routine x 2)     Status: None (Preliminary result)   Collection Time: 05/13/19 10:30 AM   Specimen: BLOOD  Result Value Ref Range Status   Specimen Description   Final    BLOOD RIGHT ANTECUBITAL Performed at [facility], [address] [place], [place] [address]    Special Requests   Final    BOTTLES DRAWN AEROBIC AND ANAEROBIC Blood Culture results may not be optimal due to an inadequate volume of blood received in culture bottles Performed at [facility], [address] [place], [place] [address]    Culture   Final    NO GROWTH 3  DAYS Performed at [facility], [place] [address] [place], [place] [address]    Report Status PENDING  Incomplete  SARS Coronavirus 2 by RT PCR (hospital order, performed in [facility] hospital lab) Nasopharyngeal Nasopharyngeal Swab     Status: None   Collection Time: 05/13/19  2:35 PM   Specimen: Nasopharyngeal Swab  Result Value Ref Range Status   SARS Coronavirus 2 NEGATIVE NEGATIVE Final    Comment: (NOTE) If result is NEGATIVE SARS-CoV-2 target nucleic acids are NOT DETECTED. The SARS-CoV-2 RNA is generally detectable in upper and lower  respiratory specimens during the acute phase of  infection. The lowest  concentration of SARS-CoV-2 viral copies this assay can detect is 250  copies / mL. A negative result does not preclude SARS-CoV-2 infection  and should not be used as the sole basis for treatment or other  patient management decisions.  A negative result may occur with  improper specimen collection / handling, submission of specimen other  than nasopharyngeal swab, presence of viral mutation(s) within the  areas targeted by this assay, and inadequate number of viral copies  (<250 copies / mL). A negative result must be combined with clinical  observations, patient history, and epidemiological information. If result is POSITIVE SARS-CoV-2 target nucleic acids are DETECTED. The SARS-CoV-2 RNA is generally detectable in upper and lower  respiratory specimens dur ing the acute phase of infection.  Positive  results are indicative of active infection with SARS-CoV-2.  Clinical  correlation with patient history and other diagnostic information is  necessary to determine patient infection status.  Positive results do  not rule out bacterial infection or co-infection with other viruses. If result is PRESUMPTIVE POSTIVE SARS-CoV-2 nucleic acids MAY BE PRESENT.   A presumptive positive result was obtained on the submitted specimen  and confirmed on repeat testing.  While 2019  novel coronavirus  (SARS-CoV-2) nucleic acids may be present in the submitted sample  additional confirmatory testing may be necessary for epidemiological  and / or clinical management purposes  to differentiate between  SARS-CoV-2 and other Sarbecovirus currently known to infect humans.  If clinically indicated additional testing with an alternate test  methodology [phone]) is advised. The SARS-CoV-2 RNA is generally  detectable in upper and lower respiratory sp ecimens during the acute  phase of infection. The expected result is Negative. Fact Sheet for Patients:  StrictlyIdeas.no Fact Sheet for Healthcare Providers: BankingDealers.co.za This test is not yet approved or cleared by the [place] FDA and has been authorized for detection and/or diagnosis of SARS-CoV-2 by FDA under an Emergency Use Authorization (EUA).  This EUA will remain in effect (meaning this test can be used) for the duration of the COVID-19 declaration under Section 564(b)(1) of the Act, 21 U.S.C. section 360bbb-3(b)(1), unless the authorization is terminated or revoked sooner. Performed at [facility], [address] [place], [place] [address]          Radiology Studies: No results found.      Scheduled Meds:  enoxaparin (LOVENOX) injection  40 mg Subcutaneous Q24H   methadone  190 mg Oral Daily   neomycin-polymyxin b-dexamethasone   Right Eye QHS   nicotine  21 mg Transdermal Daily   polyethylene glycol  17 g Oral Daily   Continuous Infusions:  piperacillin-tazobactam (ZOSYN)  IV 3.375 g (05/16/19 0530)   vancomycin 750 mg (05/16/19 1012)     LOS: 3 days    Time spent: 35 minutes.     Elmarie Shiley, MD [facility] Pager [phone]  If 7PM-7AM, please contact night-coverage [email or web address] Password [email or web address] 05/16/2019, 11:58 AM

## 2019-05-17 LAB — BASIC METABOLIC PANEL
Anion gap: 8 (ref 5–15)
BUN: 10 mg/dL (ref 6–20)
CO2: 24 mmol/L (ref 22–32)
Calcium: 8.9 mg/dL (ref 8.9–10.3)
Chloride: 103 mmol/L (ref 98–111)
Creatinine, Ser: 1.12 mg/dL — ABNORMAL HIGH (ref 0.44–1.00)
GFR calc Af Amer: >60 mL/min (ref >60)
GFR calc non Af Amer: >60 mL/min (ref >60)
Glucose, Bld: 87 mg/dL (ref 70–99)
Potassium: 4.9 mmol/L (ref 3.5–5.1)
Sodium: 135 mmol/L (ref 135–145)

## 2019-05-17 MED ORDER — VANCOMYCIN HCL IN DEXTROSE 1-5 GM/200ML-% IV SOLN
1000.0000 mg | INTRAVENOUS | Status: DC
  Administered 2019-05-17: 1000 mg via INTRAVENOUS
  Filled 2019-05-17: qty 200

## 2019-05-17 MED ORDER — METHADONE HCL 10 MG PO TABS: 190.0000 mg | ORAL_TABLET | Freq: Every day | ORAL | 0 refills | Status: AC

## 2019-05-17 MED ORDER — POLYETHYLENE GLYCOL 3350 17 G PO PACK: 17.0000 g | PACK | Freq: Every day | ORAL | 0 refills | Status: DC

## 2019-05-17 MED ORDER — NEOMYCIN-POLYMYXIN-DEXAMETH 3.5-10000-0.1 OP OINT: 1.0000 "application " | TOPICAL_OINTMENT | Freq: Every day | OPHTHALMIC | 0 refills | Status: DC

## 2019-05-17 MED ORDER — SULFAMETHOXAZOLE-TRIMETHOPRIM 800-160 MG PO TABS: 1.0000 | ORAL_TABLET | Freq: Two times a day (BID) | ORAL | 0 refills | Status: AC

## 2019-05-17 MED ORDER — AMOXICILLIN-POT CLAVULANATE 875-125 MG PO TABS: 1.0000 | ORAL_TABLET | Freq: Two times a day (BID) | ORAL | 0 refills | Status: AC

## 2019-05-17 NOTE — Discharge Summary (Signed)
Physician Discharge Summary  Casey Mitchell [id number] DOB: 13-Sep-1980 DOA: 05/13/2019  PCP: Patient, No Pcp Per  Admit date: 05/13/2019 Discharge date: 05/17/2019  Admitted From: Home Disposition: Home  Recommendations for Outpatient Follow-up:  1. Follow up with PCP in 1-2 weeks 2. Please obtain BMP/CBC in one week 3. Follow-up in resolution of preseptal cellulitis. 4. Follow-up with ophthalmology for blurry vision. 5. Will require sleep study,   Home Health: none  Discharge Condition: stable.  CODE STATUS; full code Diet recommendation: Heart Healthy   Brief/Interim Summary: 38 year old with past medical history significant for lupus not on any medication chronic pain on methadone program who presents complaining of worsening right-sided facial swelling and right thigh swelling for over 3 to 4 days.  She was evaluated at [facility] and was prescribed clindamycin, she took them antibiotics for 3 days with no significant improvement.  She presented with worsening symptoms. Evaluation in the ED she had a CT maxillofacial done showed right preseptal swelling with no post septal involvement.   1-Cellulitis of the right side of the face, preseptal: She was restarted on IV vancomycin and Zosyn, she failed oral antibiotics as an outpatient. Follow blood cultures. No growth to date.  Swelling improved. Report right eye blurry vision that has improved. Report mild discomfort on eye movement. Discussed with Ophthalmology Dr Brigitte Pulse, start Maxitrol ointment at [facility] and close follow up in the office.  Improved, she received 5 days of Vancomycin nd zosyn, significant improve,ent today. Patient will be discharge on Bactrim and Augmentin for 5 days.   2-history of lupus: Not on medication  3-Chronic pain; resume methadone. Reduce dose due to bradycardia.   4-Bradycardia; asymptomatic. Check EKG HR in the 50. Mg normal.  Monitor on telemetry. Reduce methadone dose and added holder  parameter. patient aware of her reduce dose and importance.  Bradycardia is most while she sleep. She will need evaluation for sleep apnea.    Discharge Diagnoses:  Principal Problem:   Cellulitis, face Active Problems:   Cellulitis    Discharge Instructions  Discharge Instructions    Diet - low sodium heart healthy   Complete by: As directed    Increase activity slowly   Complete by: As directed      Allergies as of 05/17/2019      Reactions   Aripiprazole Other (See Comments)   Anger Pt states it makes her angry Unknown Pt states it makes her angry Unknown   Olanzapine Other (See Comments)   Seizures  Seizures  Seizures    Sertraline Nausea And Vomiting, Other (See Comments)   Pt states it makes her ill Unknown Pt states it makes her ill      Medication List    STOP taking these medications   cephALEXin 500 MG capsule Commonly known as: KEFLEX   clindamycin 150 MG capsule Commonly known as: CLEOCIN   erythromycin ophthalmic ointment   oxyCODONE-acetaminophen 5-325 MG tablet Commonly known as: PERCOCET/ROXICET   pantoprazole 40 MG tablet Commonly known as: PROTONIX   predniSONE 20 MG tablet Commonly known as: DELTASONE   promethazine 12.5 MG tablet Commonly known as: PHENERGAN   promethazine 25 MG tablet Commonly known as: PHENERGAN   sucralfate 1 GM/10ML suspension Commonly known as: Carafate     TAKE these medications   amoxicillin-clavulanate 875-125 MG tablet Commonly known as: Augmentin Take 1 tablet by mouth 2 (two) times daily for 5 days.   chlorhexidine 0.12 % solution Commonly known as: PERIDEX 15 mLs by Mouth Rinse  route as directed. Rinse mouth with 15 ml for 30 seconds. Both in the Am and the evening after brushing teeth. Spit out after rinsing   clonazePAM 1 MG tablet Commonly known as: KLONOPIN Take 1 mg by mouth 3 (three) times daily.   dicyclomine 20 MG tablet Commonly known as: BENTYL Take 20 mg by mouth 3 (three)  times daily before meals.   HYDROcodone-acetaminophen 5-325 MG tablet Commonly known as: NORCO/VICODIN Take 1 tablet by mouth every 6 (six) hours as needed for moderate pain.   levETIRAcetam 500 MG tablet Commonly known as: KEPPRA Take 500 mg by mouth 2 (two) times daily.   methadone 10 MG tablet Commonly known as: DOLOPHINE Take 19 tablets (190 mg total) by mouth daily.   neomycin-polymyxin b-dexamethasone 3.5-10000-0.1 Oint Commonly known as: MAXITROL Place 1 application into the right eye at bedtime.   polyethylene glycol 17 g packet Commonly known as: MIRALAX / GLYCOLAX Take 17 g by mouth daily.   sulfamethoxazole-trimethoprim 800-160 MG tablet Commonly known as: BACTRIM DS Take 1 tablet by mouth 2 (two) times daily for 7 days. What changed: how much to take      Follow-up Information    Benito Mccreedy, MD Follow up on 05/22/2019.   Specialty: Internal Medicine Why: Your appointment is at 10: 00 am. Please arrive early and bring: picture ID, insurance card and current medications. Contact information: [id number] [place] [address] [phone]        Sherie Don, MD Follow up in 3 day(s).   Specialty: Ophthalmology Contact information: [address] [place] [phone]          Allergies  Allergen Reactions  . Aripiprazole Other (See Comments)    Anger Pt states it makes her angry Unknown Pt states it makes her angry Unknown   . Olanzapine Other (See Comments)    Seizures  Seizures  Seizures    . Sertraline Nausea And Vomiting and Other (See Comments)    Pt states it makes her ill Unknown Pt states it makes her ill     Consultations:  none   Procedures/Studies: Ct Maxillofacial W Contrast  Result Date: 05/13/2019 CLINICAL DATA:  Redness and swelling over the right orbit with concern for facial cellulitis EXAM: CT MAXILLOFACIAL WITH CONTRAST TECHNIQUE: Multidetector CT imaging of  the maxillofacial structures was performed with intravenous contrast. Multiplanar CT image reconstructions were also generated. CONTRAST:  32mL OMNIPAQUE IOHEXOL 300 MG/ML  SOLN COMPARISON:  Head CT 03/29/2018 FINDINGS: Osseous: Negative for fracture or destructive process. Missing upper incisors and left canine which were likely extracted recently. Orbits: Right preseptal swelling that is mild in centered on the lids. No postseptal edema or abscess. Superior ophthalmic veins are enhancing. Unremarkable appearance of the globes, extraocular muscles, and lacrimal glands. Sinuses: Clear Soft tissues: As above Limited intracranial: Negative IMPRESSION: Right preseptal swelling.  No postseptal inflammation or abscess. Electronically Signed   By: Monte Fantasia M.D.   On: 05/13/2019 11:27    Subjective: Facial Swelling and redness has improved. Blurry vision improved.   Discharge Exam: Vitals:   05/17/19 0059 05/17/19 0340  BP: 119/69 107/60  Pulse: (!) 57 (!) 52  Resp:  18  Temp:  98.3 F (36.8 C)  SpO2:  99%     General: Pt is alert, awake, not in acute distress Cardiovascular: RRR, S1/S2 +, no rubs, no gallops Respiratory: CTA bilaterally, no wheezing, no rhonchi Abdominal: Soft, NT, ND, bowel sounds +  Extremities: no edema, no cyanosis    The results of significant diagnostics from this hospitalization (including imaging, microbiology, ancillary and laboratory) are listed below for reference.     Microbiology: Recent Results (from the past 240 hour(s))  Blood culture (routine x 2)     Status: None (Preliminary result)   Collection Time: 05/13/19 10:20 AM   Specimen: BLOOD RIGHT ARM  Result Value Ref Range Status   Specimen Description   Final    BLOOD RIGHT ARM Performed at [facility], [address], [place], [place] [address]    Special Requests   Final    BOTTLES DRAWN AEROBIC AND ANAEROBIC Blood Culture results may not be optimal due to an inadequate volume of  blood received in culture bottles Performed at [facility], [address] [place], [place] [address]    Culture   Final    NO GROWTH 3 DAYS Performed at [facility], [place] [address] [place], [place] [address]    Report Status PENDING  Incomplete  Blood culture (routine x 2)     Status: None (Preliminary result)   Collection Time: 05/13/19 10:30 AM   Specimen: BLOOD  Result Value Ref Range Status   Specimen Description   Final    BLOOD RIGHT ANTECUBITAL Performed at [facility], [address] [place], [place] [address]    Special Requests   Final    BOTTLES DRAWN AEROBIC AND ANAEROBIC Blood Culture results may not be optimal due to an inadequate volume of blood received in culture bottles Performed at [facility], [address] [place], [place] [address]    Culture   Final    NO GROWTH 3 DAYS Performed at [facility], [place] [address], [place],  [address]    Report Status PENDING  Incomplete  SARS Coronavirus 2 by RT PCR (hospital order, performed in [facility] hospital lab) Nasopharyngeal Nasopharyngeal Swab     Status: None   Collection Time: 05/13/19  2:35 PM   Specimen: Nasopharyngeal Swab  Result Value Ref Range Status   SARS Coronavirus 2 NEGATIVE NEGATIVE Final    Comment: (NOTE) If result is NEGATIVE SARS-CoV-2 target nucleic acids are NOT DETECTED. The SARS-CoV-2 RNA is generally detectable in upper and lower  respiratory specimens during the acute phase of infection. The lowest  concentration of SARS-CoV-2 viral copies this assay can detect is 250  copies / mL. A negative result does not preclude SARS-CoV-2 infection  and should not be used as the sole basis for treatment or other  patient management decisions.  A negative result may occur with  improper specimen collection / handling, submission of specimen other  than nasopharyngeal swab, presence of viral mutation(s) within the  areas targeted by this  assay, and inadequate number of viral copies  (<250 copies / mL). A negative result must be combined with clinical  observations, patient history, and epidemiological information. If result is POSITIVE SARS-CoV-2 target nucleic acids are DETECTED. The SARS-CoV-2 RNA is generally detectable in upper and lower  respiratory specimens dur ing the acute phase of infection.  Positive  results are indicative of active infection with SARS-CoV-2.  Clinical  correlation with patient history and other diagnostic information is  necessary to determine patient infection status.  Positive results do  not rule out bacterial infection or co-infection with other viruses. If result is PRESUMPTIVE POSTIVE SARS-CoV-2 nucleic acids MAY BE PRESENT.  A presumptive positive result was obtained on the submitted specimen  and confirmed on repeat testing.  While 2019 novel coronavirus  (SARS-CoV-2) nucleic acids may be present in the submitted sample  additional confirmatory testing may be necessary for epidemiological  and / or clinical management purposes  to differentiate between  SARS-CoV-2 and other Sarbecovirus currently known to infect humans.  If clinically indicated additional testing with an alternate test  methodology [phone]) is advised. The SARS-CoV-2 RNA is generally  detectable in upper and lower respiratory sp ecimens during the acute  phase of infection. The expected result is Negative. Fact Sheet for Patients:  StrictlyIdeas.no Fact Sheet for Healthcare Providers: BankingDealers.co.za This test is not yet approved or cleared by the [place] FDA and has been authorized for detection and/or diagnosis of SARS-CoV-2 by FDA under an Emergency Use Authorization (EUA).  This EUA will remain in effect (meaning this test can be used) for the duration of the COVID-19 declaration under Section 564(b)(1) of the Act, 21 U.S.C. section 360bbb-3(b)(1),  unless the authorization is terminated or revoked sooner. Performed at [facility], [address] [place], [place] [address]      Labs: BNP (last 3 results) No results for input(s): BNP in the last 8760 hours. Basic Metabolic Panel: Recent Labs  Lab 05/13/19 1021 05/14/19 0358 05/16/19 0322 05/16/19 0955 05/17/19 0357  NA 136 135 137  --  135  K 4.1 4.3 4.3  --  4.9  CL 99 101 102  --  103  CO2 26 22 24   --  24  GLUCOSE 68* 81 80  --  87  BUN 10 9 9   --  10  CREATININE 0.75 0.85 1.15*  --  1.12*  CALCIUM 9.3 8.8* 9.0  --  8.9  MG  --   --   --  2.2  --    Liver Function Tests: Recent Labs  Lab 05/14/19 0358  AST 32  ALT 25  ALKPHOS 81  BILITOT 0.4  PROT 6.5  ALBUMIN 3.4*   No results for input(s): LIPASE, AMYLASE in the last 168 hours. No results for input(s): AMMONIA in the last 168 hours. CBC: Recent Labs  Lab 05/13/19 1021 05/14/19 0358  WBC 9.2 7.0  NEUTROABS 6.0 4.4  HGB 11.1* 10.8*  HCT 38.0 34.8*  MCV 81.4 78.7*  PLT 290 245   Cardiac Enzymes: No results for input(s): CKTOTAL, CKMB, CKMBINDEX, TROPONINI in the last 168 hours. BNP: Invalid input(s): POCBNP CBG: No results for input(s): GLUCAP in the last 168 hours. D-Dimer No results for input(s): DDIMER in the last 72 hours. Hgb A1c No results for input(s): HGBA1C in the last 72 hours. Lipid Profile No results for input(s): CHOL, HDL, LDLCALC, TRIG, CHOLHDL, LDLDIRECT in the last 72 hours. Thyroid function studies Recent Labs    05/16/19 0955  TSH 2.629   Anemia work up No results for input(s): VITAMINB12, FOLATE, FERRITIN, TIBC, IRON, RETICCTPCT in the last 72 hours. Urinalysis    Component Value Date/Time   COLORURINE YELLOW 07/29/2015 1507   APPEARANCEUR CLOUDY (A) 07/29/2015 1507   LABSPEC 1.018 07/29/2015 1507   PHURINE 6.5 07/29/2015 1507   GLUCOSEU NEGATIVE 07/29/2015 1507   HGBUR TRACE (A) 07/29/2015 1507   BILIRUBINUR NEGATIVE 07/29/2015 1507   KETONESUR  NEGATIVE 07/29/2015 1507   PROTEINUR NEGATIVE 07/29/2015 1507   NITRITE NEGATIVE 07/29/2015 1507   LEUKOCYTESUR SMALL (A) 07/29/2015 1507   Sepsis Labs Invalid input(s): PROCALCITONIN,  WBC,  [facility] Recent Results (from the past 240 hour(s))  Blood culture (routine x 2)     Status: None (Preliminary result)   Collection Time: 05/13/19 10:20 AM   Specimen: BLOOD RIGHT ARM  Result Value Ref Range Status   Specimen Description   Final    BLOOD RIGHT ARM Performed at [facility], [address] [place], [place] [address]    Special Requests   Final    BOTTLES DRAWN AEROBIC AND ANAEROBIC Blood Culture results may not be optimal due to an inadequate volume of blood received in culture bottles Performed at [facility], [address] [place], [place] [address]    Culture   Final    NO GROWTH 3 DAYS Performed at [facility], [place] [address], [place], [place] [address]    Report Status PENDING  Incomplete  Blood culture (routine x 2)     Status: None (Preliminary result)   Collection Time: 05/13/19 10:30 AM   Specimen: BLOOD  Result Value Ref Range Status   Specimen Description   Final    BLOOD RIGHT ANTECUBITAL Performed at [facility], [address] [place], [place] [address]    Special Requests   Final    BOTTLES DRAWN AEROBIC AND ANAEROBIC Blood Culture results may not be optimal due to an inadequate volume of blood received in culture bottles Performed at [facility], [address] [place], [place] [address]    Culture   Final    NO GROWTH 3 DAYS Performed at [facility], [place] [address], [place],  [address]    Report Status PENDING  Incomplete  SARS Coronavirus 2 by RT PCR (hospital order, performed in [facility] hospital lab) Nasopharyngeal Nasopharyngeal Swab     Status: None   Collection Time: 05/13/19  2:35 PM   Specimen: Nasopharyngeal Swab  Result Value Ref Range Status   SARS  Coronavirus 2 NEGATIVE NEGATIVE Final    Comment: (NOTE) If result is NEGATIVE SARS-CoV-2 target nucleic acids are NOT DETECTED. The SARS-CoV-2 RNA is generally detectable in upper and lower  respiratory specimens during the acute phase of infection. The lowest  concentration of SARS-CoV-2 viral copies this assay can detect is 250  copies / mL. A negative result does not preclude SARS-CoV-2 infection  and should not be used as the sole basis for treatment or other  patient management decisions.  A negative result may occur with  improper specimen collection / handling, submission of specimen other  than nasopharyngeal swab, presence of viral mutation(s) within the  areas targeted by this assay, and inadequate number of viral copies  (<250 copies / mL). A negative result must be combined with clinical  observations, patient history, and epidemiological information. If result is POSITIVE SARS-CoV-2 target nucleic acids are DETECTED. The SARS-CoV-2 RNA is generally detectable in upper and lower  respiratory specimens dur ing the acute phase of infection.  Positive  results are indicative of active infection with SARS-CoV-2.  Clinical  correlation with patient history and other diagnostic information is  necessary to determine patient infection status.  Positive results do  not rule out bacterial infection or co-infection with other viruses. If result is PRESUMPTIVE POSTIVE SARS-CoV-2 nucleic acids MAY BE PRESENT.   A presumptive positive result was obtained on the submitted specimen  and confirmed on repeat testing.  While 2019 novel coronavirus  (SARS-CoV-2) nucleic acids may be present in the  submitted sample  additional confirmatory testing may be necessary for epidemiological  and / or clinical management purposes  to differentiate between  SARS-CoV-2 and other Sarbecovirus currently known to infect humans.  If clinically indicated additional testing with an alternate test   methodology [phone]) is advised. The SARS-CoV-2 RNA is generally  detectable in upper and lower respiratory sp ecimens during the acute  phase of infection. The expected result is Negative. Fact Sheet for Patients:  StrictlyIdeas.no Fact Sheet for Healthcare Providers: BankingDealers.co.za This test is not yet approved or cleared by the [place] FDA and has been authorized for detection and/or diagnosis of SARS-CoV-2 by FDA under an Emergency Use Authorization (EUA).  This EUA will remain in effect (meaning this test can be used) for the duration of the COVID-19 declaration under Section 564(b)(1) of the Act, 21 U.S.C. section 360bbb-3(b)(1), unless the authorization is terminated or revoked sooner. Performed at [facility], [address], [place], [place] [address]      Time coordinating discharge: 40 minutes  SIGNED:   Elmarie Shiley, MD  [facility]

## 2019-05-17 NOTE — Progress Notes (Signed)
Pharmacy Antibiotic Note  Casey Mitchell is a 38 y.o. female admitted on 05/13/2019 with facial cellulitis.  Pharmacy has been consulted for Vancomycin/Zosyn dosing   IV antibiotics since 10 / 22 with improvement Scr with slight trend up  Blood cultures negative to date WBC stable, afebrile    Plan: Adjust Vancomycin to 1 gram iv Q 24 hours Expected AUC: 446 SCr used: 1.12 Zosyn 3.375g IV q8h (4h infusion)  Planning transition to oral antibiotics soon  Height: 5\' 4"  (162.6 cm) Weight: 187 lb 13.3 oz (85.2 kg) IBW/kg (Calculated) : 54.7  Temp (24hrs), Avg:98.2 F (36.8 C), Min:98 F (36.7 C), Max:98.5 F (36.9 C)  Recent Labs  Lab 05/13/19 1021 05/14/19 0358 05/16/19 0322 05/17/19 0357  WBC 9.2 7.0  --   --   CREATININE 0.75 0.85 1.15* 1.12*    Estimated Creatinine Clearance: 71.2 mL/min (A) (by C-G formula based on SCr of 1.12 mg/dL (H)).    Allergies  Allergen Reactions  . Aripiprazole Other (See Comments)    Anger Pt states it makes her angry Unknown Pt states it makes her angry Unknown   . Olanzapine Other (See Comments)    Seizures  Seizures  Seizures    . Sertraline Nausea And Vomiting and Other (See Comments)    Pt states it makes her ill Unknown Pt states it makes her ill    Thank you Anette Guarneri, PharmD Please check AMION for all [facility] contact numbers Clinical Pharmacist 05/17/2019 8:02 AM

## 2019-05-17 NOTE — Progress Notes (Signed)
AVS reviewed with patient and patient given a copy along with prescriptions. All lines removed, patient dressed, belongings packed, and patient taken by CNA via wheelchair to her mother's car for discharge.

## 2019-05-18 LAB — CULTURE, BLOOD (ROUTINE X 2)
Culture: NO GROWTH
Culture: NO GROWTH

## 2019-06-30 DIAGNOSIS — K625 Hemorrhage of anus and rectum: Secondary | ICD-10-CM | POA: Insufficient documentation

## 2019-06-30 DIAGNOSIS — R1010 Upper abdominal pain, unspecified: Secondary | ICD-10-CM | POA: Insufficient documentation

## 2019-08-10 ENCOUNTER — Emergency Department (HOSPITAL_BASED_OUTPATIENT_CLINIC_OR_DEPARTMENT_OTHER)
Admission: EM | Admit: 2019-08-10 | Discharge: 2019-08-10 | Disposition: A | Discharge status: Home / Self Care | Payer: Medicaid Other | Attending: Emergency Medicine | Admitting: Emergency Medicine

## 2019-08-10 ENCOUNTER — Emergency Department (HOSPITAL_BASED_OUTPATIENT_CLINIC_OR_DEPARTMENT_OTHER): Payer: Medicaid Other

## 2019-08-10 ENCOUNTER — Other Ambulatory Visit: Payer: Self-pay

## 2019-08-10 ENCOUNTER — Encounter (HOSPITAL_BASED_OUTPATIENT_CLINIC_OR_DEPARTMENT_OTHER): Payer: Self-pay

## 2019-08-10 DIAGNOSIS — R05 Cough: Secondary | ICD-10-CM | POA: Diagnosis present

## 2019-08-10 DIAGNOSIS — R509 Fever, unspecified: Secondary | ICD-10-CM | POA: Diagnosis not present

## 2019-08-10 DIAGNOSIS — Z5321 Procedure and treatment not carried out due to patient leaving prior to being seen by health care provider: Secondary | ICD-10-CM | POA: Insufficient documentation

## 2019-08-10 HISTORY — DX: Gastritis, unspecified, without bleeding: K29.70

## 2019-08-10 NOTE — ED Triage Notes (Signed)
Pt c/o cough, fever, body aches x 1 week-NAD-steady gait

## 2019-08-16 ENCOUNTER — Other Ambulatory Visit: Payer: Self-pay

## 2019-08-16 ENCOUNTER — Emergency Department (HOSPITAL_COMMUNITY)
Admission: EM | Admit: 2019-08-16 | Discharge: 2019-08-16 | Disposition: A | Discharge status: Home / Self Care | Payer: Medicaid Other | Attending: Emergency Medicine | Admitting: Emergency Medicine

## 2019-08-16 ENCOUNTER — Emergency Department (HOSPITAL_COMMUNITY): Payer: Medicaid Other

## 2019-08-16 DIAGNOSIS — M3210 Systemic lupus erythematosus, organ or system involvement unspecified: Secondary | ICD-10-CM | POA: Diagnosis not present

## 2019-08-16 DIAGNOSIS — Z79899 Other long term (current) drug therapy: Secondary | ICD-10-CM | POA: Insufficient documentation

## 2019-08-16 DIAGNOSIS — Z20822 Contact with and (suspected) exposure to covid-19: Secondary | ICD-10-CM | POA: Diagnosis not present

## 2019-08-16 DIAGNOSIS — R52 Pain, unspecified: Secondary | ICD-10-CM

## 2019-08-16 DIAGNOSIS — M7918 Myalgia, other site: Secondary | ICD-10-CM | POA: Diagnosis not present

## 2019-08-16 DIAGNOSIS — F17210 Nicotine dependence, cigarettes, uncomplicated: Secondary | ICD-10-CM | POA: Insufficient documentation

## 2019-08-16 DIAGNOSIS — R5383 Other fatigue: Secondary | ICD-10-CM | POA: Diagnosis present

## 2019-08-16 DIAGNOSIS — F319 Bipolar disorder, unspecified: Secondary | ICD-10-CM | POA: Insufficient documentation

## 2019-08-16 LAB — NOVEL CORONAVIRUS, NAA (HOSP ORDER, SEND-OUT TO REF LAB; TAT 18-24 HRS): SARS-CoV-2, NAA: NOT DETECTED

## 2019-08-16 NOTE — ED Notes (Signed)
Pt verbalized understanding of d/c instructions, follow up care and s/s requiring return to ed. Pt had no further questions at this time.

## 2019-08-16 NOTE — ED Triage Notes (Signed)
Pt reporting body aches, fevers, sore throat, weakness. Says she was tested two weeks ago and it was negative

## 2019-08-16 NOTE — ED Provider Notes (Addendum)
[facility] EMERGENCY DEPARTMENT Provider Note   CSN: [id number] Arrival date & time: 08/16/19  0055     History Chief Complaint  Patient presents with  . Fever    Casey Mitchell is a 39 y.o. female.  The history is provided by the patient and medical records.     39 year old female with history of anxiety, bipolar disorder, depression, lupus, hyperlipidemia, presenting to the ED for reportedly feeling unwell for the past 2 weeks.  States she feels very fatigued, generalized body aches, and intermittent fevers for about 2 weeks now.  States she was tested about 2 weeks ago that was negative but still feels like something is wrong.  States she works at a [employer] and they have not been telling them that they have had any exposures.  She denies any significant cough or shortness of breath.  No chest pain.  She reports she went to OB/GYN clinic recently for follow-up appointment for abnormal Pap smear and was sent to the ER with concern of "blood being low".  States she went to the ER and they refused to evaluate her.  Patient here with father who is also being evaluated for fever.  Past Medical History:  Diagnosis Date  . Anxiety   . Bipolar affective disorder ([place])   . Depression   . Gastritis   . High cholesterol   . Lupus [facility])     Patient Active Problem List   Diagnosis Date Noted  . Cellulitis 05/14/2019  . Cellulitis, face 05/13/2019  . Bipolar affective disorder [facility])     Past Surgical History:  Procedure Laterality Date  . CESAREAN SECTION    . TONSILLECTOMY    . TUBAL LIGATION       OB History   No obstetric history on file.     No family history on file.  Social History   Tobacco Use  . Smoking status: Current Every Day Smoker  . Smokeless tobacco: Current User  Substance Use Topics  . Alcohol use: No  . Drug use: No    Home Medications Prior to Admission medications   Medication Sig Start Date End Date Taking?  Authorizing Provider  chlorhexidine (PERIDEX) 0.12 % solution 15 mLs by Mouth Rinse route as directed. Rinse mouth with 15 ml for 30 seconds. Both in the Am and the evening after brushing teeth. Spit out after rinsing 03/12/19   [provider]  clonazePAM (KLONOPIN) 1 MG tablet Take 1 mg by mouth 3 (three) times daily.    [provider]  dicyclomine (BENTYL) 20 MG tablet Take 20 mg by mouth 3 (three) times daily before meals.    [provider]  HYDROcodone-acetaminophen (NORCO/VICODIN) 5-325 MG per tablet Take 1 tablet by mouth every 6 (six) hours as needed for moderate pain.    [provider]  levETIRAcetam (KEPPRA) 500 MG tablet Take 500 mg by mouth 2 (two) times daily. 05/15/18   [provider]  methadone (DOLOPHINE) 10 MG tablet Take 19 tablets (190 mg total) by mouth daily. 05/17/19   Regalado, Belkys A, MD  neomycin-polymyxin b-dexamethasone (MAXITROL) 3.5-10000-0.1 OINT Place 1 application into the right eye at bedtime. 05/17/19   Regalado, Belkys A, MD  polyethylene glycol (MIRALAX / GLYCOLAX) 17 g packet Take 17 g by mouth daily. 05/17/19   Regalado, Belkys A, MD    Allergies    Aripiprazole, Olanzapine, and Sertraline  Review of Systems   Review of Systems  Constitutional: Positive for fatigue  and fever.  Respiratory: Positive for cough.   All other systems reviewed and are negative.   Physical Exam Updated Vital Signs BP 131/68 (BP Location: Left Arm)   Pulse 86   Temp 98.2 F (36.8 C) (Oral)   Resp 15   Ht 5\' 4"  (1.626 m)   Wt 81.6 kg   LMP 08/05/2019 (LMP Unknown)   SpO2 100%   BMI 30.90 kg/m   Physical Exam Vitals and nursing note reviewed.  Constitutional:      Appearance: She is well-developed.     Comments: Appears under the influence, slurring words and intermittently falling asleep during exam  HENT:     Head: Normocephalic and atraumatic.  Eyes:     Conjunctiva/sclera: Conjunctivae normal.     Pupils:  Pupils are equal, round, and reactive to light.  Cardiovascular:     Rate and Rhythm: Normal rate and regular rhythm.     Heart sounds: Normal heart sounds.  Pulmonary:     Effort: Pulmonary effort is normal.     Breath sounds: Normal breath sounds. No wheezing or rhonchi.     Comments: No cough observed, lungs grossly clear, no distress noted Abdominal:     General: Bowel sounds are normal.     Palpations: Abdomen is soft.  Musculoskeletal:        General: Normal range of motion.     Cervical back: Normal range of motion.  Skin:    General: Skin is warm and dry.  Neurological:     Mental Status: She is alert and oriented to person, place, and time.     ED Results / Procedures / Treatments   Labs (all labs ordered are listed, but only abnormal results are displayed) Labs Reviewed  NOVEL CORONAVIRUS, NAA (HOSP ORDER, SEND-OUT TO REF LAB; TAT 18-24 HRS)    EKG None  Radiology DG Chest Port 1 View  Result Date: 08/16/2019 CLINICAL DATA:  Body aches and fever EXAM: PORTABLE CHEST 1 VIEW COMPARISON:  1920 FINDINGS: The heart size and mediastinal contours are within normal limits. Both lungs are clear. The visualized skeletal structures are unremarkable. IMPRESSION: Clear lungs Electronically Signed   By: Ulyses Jarred M.D.   On: 08/16/2019 01:50    Procedures Procedures (including critical care time)  Medications Ordered in ED Medications - No data to display  ED Course  I have reviewed the triage vital signs and the nursing notes.  Pertinent labs & imaging results that were available during my care of the patient were reviewed by me and considered in my medical decision making (see chart for details).    MDM Rules/Calculators/A&P  39 year old female here reportedly feeling unwell for about 2 weeks.  Recent Covid testing that she reports was negative.  Reported that went to OB/GYN clinic for exam after abnormal Pap smear and was sent to the ER with concern of "low  blood".  On chart review, it appears in the office she was stumbling down the hall, falling asleep in the wheelchair, and unable give adequate history or answer RN triage questions.  She was then sent to the ER at [facility], assumed to be under the influence.  She did have lab work at that time which was grossly normal.  It appears she left prior to seeing a physician when asked about polysubstance abuse by nursing staff.  Here today, she does appear under the influence, slurring her words and intermittently falling asleep during questioning.  Her vitals are overall stable.  Pupils  are pinpoint but reactive.  It appears she is on multiple controlled substances including methadone.  When questioned about this she does get somewhat agitated and aggressive, reports she has not had methadone in 4 days.  Given she had reassuring lab work on 08/13/2019, I do not feel this needs to emergently be repeated today.  Will obtain portable chest x-ray, repeat Covid swab.    CXR negative.  Patient has remained hemodynamically stable here in the ED.  Do not feel she requires further intervention or work-up at this time.  Can follow-up as an OP with PCP.  Return here for any new/acute changes.  Will ensure she has safe ride home.  Casey Mitchell was evaluated in Emergency Department on 08/16/2019 for the symptoms described in the history of present illness. She was evaluated in the context of the global COVID-19 pandemic, which necessitated consideration that the patient might be at risk for infection with the SARS-CoV-2 virus that causes COVID-19. Institutional protocols and algorithms that pertain to the evaluation of patients at risk for COVID-19 are in a state of rapid change based on information released by regulatory bodies including the CDC and federal and state organizations. These policies and algorithms were followed during the patient's care in the ED.  Final Clinical Impression(s) / ED Diagnoses Final diagnoses:  Body  aches    Rx / DC Orders ED Discharge Orders    None       Larene Pickett, PA-C 08/16/19 0236    Larene Pickett, PA-C 08/16/19 0236    Maudie Flakes, MD 08/16/19 [phone]

## 2019-08-16 NOTE — Discharge Instructions (Signed)
X-ray was normal. Covid screen has been sent, you will be notified if positive. Follow-up with your primary care doctor. Return here for new concerns.

## 2019-08-18 ENCOUNTER — Emergency Department (HOSPITAL_BASED_OUTPATIENT_CLINIC_OR_DEPARTMENT_OTHER)
Admission: EM | Admit: 2019-08-18 | Discharge: 2019-08-18 | Disposition: A | Discharge status: Home / Self Care | Payer: Medicaid Other | Attending: Emergency Medicine | Admitting: Emergency Medicine

## 2019-08-18 ENCOUNTER — Emergency Department (HOSPITAL_BASED_OUTPATIENT_CLINIC_OR_DEPARTMENT_OTHER): Payer: Medicaid Other

## 2019-08-18 ENCOUNTER — Other Ambulatory Visit: Payer: Self-pay

## 2019-08-18 ENCOUNTER — Encounter (HOSPITAL_BASED_OUTPATIENT_CLINIC_OR_DEPARTMENT_OTHER): Payer: Self-pay | Admitting: *Deleted

## 2019-08-18 DIAGNOSIS — Z79891 Long term (current) use of opiate analgesic: Secondary | ICD-10-CM | POA: Diagnosis not present

## 2019-08-18 DIAGNOSIS — F1910 Other psychoactive substance abuse, uncomplicated: Secondary | ICD-10-CM | POA: Diagnosis not present

## 2019-08-18 DIAGNOSIS — R55 Syncope and collapse: Secondary | ICD-10-CM | POA: Diagnosis present

## 2019-08-18 DIAGNOSIS — F17200 Nicotine dependence, unspecified, uncomplicated: Secondary | ICD-10-CM | POA: Diagnosis not present

## 2019-08-18 DIAGNOSIS — F17220 Nicotine dependence, chewing tobacco, uncomplicated: Secondary | ICD-10-CM | POA: Insufficient documentation

## 2019-08-18 HISTORY — DX: Other psychoactive substance dependence, uncomplicated: F19.20

## 2019-08-18 LAB — RAPID URINE DRUG SCREEN, HOSP PERFORMED
Amphetamines: NOT DETECTED
Barbiturates: NOT DETECTED
Benzodiazepines: POSITIVE — AB
Cocaine: POSITIVE — AB
Opiates: POSITIVE — AB
Tetrahydrocannabinol: POSITIVE — AB

## 2019-08-18 LAB — CBC WITH DIFFERENTIAL/PLATELET
Abs Immature Granulocytes: 0.05 10*3/uL (ref 0.00–0.07)
Basophils Absolute: 0.1 10*3/uL (ref 0.0–0.1)
Basophils Relative: 1 %
Eosinophils Absolute: 0.4 10*3/uL (ref 0.0–0.5)
Eosinophils Relative: 6 %
HCT: 32.4 % — ABNORMAL LOW (ref 36.0–46.0)
Hemoglobin: 9.7 g/dL — ABNORMAL LOW (ref 12.0–15.0)
Immature Granulocytes: 1 %
Lymphocytes Relative: 25 %
Lymphs Abs: 1.7 10*3/uL (ref 0.7–4.0)
MCH: 24.9 pg — ABNORMAL LOW (ref 26.0–34.0)
MCHC: 29.9 g/dL — ABNORMAL LOW (ref 30.0–36.0)
MCV: 83.3 fL (ref 80.0–100.0)
Monocytes Absolute: 0.6 10*3/uL (ref 0.1–1.0)
Monocytes Relative: 9 %
Neutro Abs: 4.1 10*3/uL (ref 1.7–7.7)
Neutrophils Relative %: 58 %
Platelets: 239 10*3/uL (ref 150–400)
RBC: 3.89 MIL/uL (ref 3.87–5.11)
RDW: 17.9 % — ABNORMAL HIGH (ref 11.5–15.5)
WBC: 7 10*3/uL (ref 4.0–10.5)
nRBC: 0 % (ref 0.0–0.2)

## 2019-08-18 LAB — URINALYSIS, ROUTINE W REFLEX MICROSCOPIC
Bilirubin Urine: NEGATIVE
Glucose, UA: NEGATIVE mg/dL
Ketones, ur: NEGATIVE mg/dL
Leukocytes,Ua: NEGATIVE
Nitrite: NEGATIVE
Protein, ur: NEGATIVE mg/dL
Specific Gravity, Urine: 1.02 (ref 1.005–1.030)
pH: 6.5 (ref 5.0–8.0)

## 2019-08-18 LAB — PREGNANCY, URINE: Preg Test, Ur: NEGATIVE

## 2019-08-18 LAB — URINALYSIS, MICROSCOPIC (REFLEX)

## 2019-08-18 LAB — COMPREHENSIVE METABOLIC PANEL
ALT: 16 U/L (ref 0–44)
AST: 25 U/L (ref 15–41)
Albumin: 3.8 g/dL (ref 3.5–5.0)
Alkaline Phosphatase: 81 U/L (ref 38–126)
Anion gap: 7 (ref 5–15)
BUN: 8 mg/dL (ref 6–20)
CO2: 24 mmol/L (ref 22–32)
Calcium: 9.3 mg/dL (ref 8.9–10.3)
Chloride: 103 mmol/L (ref 98–111)
Creatinine, Ser: 0.92 mg/dL (ref 0.44–1.00)
GFR calc Af Amer: >60 mL/min (ref >60)
GFR calc non Af Amer: >60 mL/min (ref >60)
Glucose, Bld: 96 mg/dL (ref 70–99)
Potassium: 4.3 mmol/L (ref 3.5–5.1)
Sodium: 134 mmol/L — ABNORMAL LOW (ref 135–145)
Total Bilirubin: 0.6 mg/dL (ref 0.3–1.2)
Total Protein: 7.4 g/dL (ref 6.5–8.1)

## 2019-08-18 LAB — ETHANOL: Alcohol, Ethyl (B): <10 mg/dL (ref <10)

## 2019-08-18 NOTE — ED Provider Notes (Signed)
Emergency Department Provider Note   I have reviewed the triage vital signs and the nursing notes.   HISTORY  Chief Complaint Multipe Complaints   HPI Darica Delillo is a 39 y.o. female with PMH of Bipolar disorder, Lupus (untreated), HLD, and chronic pain on methadone presents to the emergency department with multiple complaints including total body pain, syncope, HA.  She tells me that symptoms have been going on for the past 2 months with some worsening in the last 2 weeks.  She describes fatigue and that she was at her OB/GYN's office where blood work was drawn and she was, by her report, wheeled out to the car and told to go directly to the emergency department.  She tells me this happened on Friday and she went to the [place] ER.  She is not sure what was done but she was sent home.  With symptoms continuing she returns to the emergency department for reevaluation with concern that she may need a blood transfusion.  She denies any focal abdominal pain, chest pain, shortness of breath, heart palpitations.  She tells me that she has been completely off of her methadone for the past 4 days because she does not have the money to continue.  She is not experiencing diarrhea or other withdrawal symptoms that she has experienced in the past. Denies fever.   Past Medical History:  Diagnosis Date  . Anxiety   . Bipolar affective disorder ([place])   . Depression   . Drug addiction ([place])   . Gastritis   . High cholesterol   . Lupus [facility])     Patient Active Problem List   Diagnosis Date Noted  . Cellulitis 05/14/2019  . Cellulitis, face 05/13/2019  . Bipolar affective disorder [facility])     Past Surgical History:  Procedure Laterality Date  . CESAREAN SECTION    . TONSILLECTOMY    . TUBAL LIGATION      Allergies Aripiprazole, Olanzapine, and Sertraline  No family history on file.  Social History Social History   Tobacco Use  . Smoking status: Current Every Day Smoker  .  Smokeless tobacco: Current User  Substance Use Topics  . Alcohol use: No  . Drug use: No    Review of Systems  Constitutional: No fever/chills. Positive fatigue.  Eyes: No visual changes. ENT: No sore throat. Cardiovascular: Denies chest pain. Respiratory: Denies shortness of breath. Gastrointestinal: No abdominal pain.  No nausea, no vomiting.  No diarrhea.  No constipation. Genitourinary: Negative for dysuria. Musculoskeletal: Negative for back pain. Skin: Negative for rash. Neurological: Negative for focal weakness or numbness. Positive HA.   10-point ROS otherwise negative.  ____________________________________________   PHYSICAL EXAM:  VITAL SIGNS: ED Triage Vitals  Enc Vitals Group     BP 08/18/19 1108 115/67     Pulse Rate 08/18/19 1108 85     Resp 08/18/19 1108 18     Temp 08/18/19 1108 97.7 F (36.5 C)     Temp Source 08/18/19 1108 Oral     SpO2 08/18/19 1108 100 %     Weight 08/18/19 1105 180 lb (81.6 kg)     Height 08/18/19 1105 5\' 4"  (1.626 m)   Constitutional: Drowsy but providing a full history. No acute distress.  Eyes: Conjunctivae are normal.  Head: Atraumatic. Nose: No congestion/rhinnorhea. Mouth/Throat: Mucous membranes are moist.   Neck: No stridor.   Cardiovascular: Normal rate, regular rhythm. Good peripheral circulation. Grossly normal heart sounds.   Respiratory: Normal respiratory effort.  No retractions. Lungs CTAB. Gastrointestinal: Soft and nontender. No distention.  Musculoskeletal: No gross deformities of extremities. Neurologic:  Normal speech and language. No gross focal neurologic deficits are appreciated.  Normal strength and sensation in the bilateral upper and lower extremities.  No cranial nerve deficits 2-12.  Skin:  Skin is warm, dry and intact. No rash noted.   ____________________________________________   LABS (all labs ordered are listed, but only abnormal results are displayed)  Labs Reviewed  COMPREHENSIVE  METABOLIC PANEL - Abnormal; Notable for the following components:      Result Value   Sodium 134 (*)    All other components within normal limits  RAPID URINE DRUG SCREEN, HOSP PERFORMED - Abnormal; Notable for the following components:   Opiates POSITIVE (*)    Cocaine POSITIVE (*)    Benzodiazepines POSITIVE (*)    Tetrahydrocannabinol POSITIVE (*)    All other components within normal limits  URINALYSIS, ROUTINE W REFLEX MICROSCOPIC - Abnormal; Notable for the following components:   APPearance CLOUDY (*)    Hgb urine dipstick LARGE (*)    All other components within normal limits  CBC WITH DIFFERENTIAL/PLATELET - Abnormal; Notable for the following components:   Hemoglobin 9.7 (*)    HCT 32.4 (*)    MCH 24.9 (*)    MCHC 29.9 (*)    RDW 17.9 (*)    All other components within normal limits  URINALYSIS, MICROSCOPIC (REFLEX) - Abnormal; Notable for the following components:   Bacteria, UA MANY (*)    All other components within normal limits  ETHANOL  PREGNANCY, URINE  CBC WITH DIFFERENTIAL/PLATELET   ____________________________________________  EKG   EKG Interpretation  Date/Time:  Tuesday August 18 2019 12:44:24 EST Ventricular Rate:  75 PR Interval:    QRS Duration: 84 QT Interval:  402 QTC Calculation: 449 R Axis:   124 Text Interpretation: Sinus rhythm Right axis deviation Borderline T abnormalities, anterior leads No STEMI Confirmed by Nanda Quinton [phone]) on 08/18/2019 1:25:11 PM       ____________________________________________  RADIOLOGY  CT Head Wo Contrast  Result Date: 08/18/2019 CLINICAL DATA:  Headaches, slurred speech EXAM: CT HEAD WITHOUT CONTRAST TECHNIQUE: Contiguous axial images were obtained from the base of the skull through the vertex without intravenous contrast. COMPARISON:  03/29/2018 FINDINGS: Brain: No evidence of acute infarction, hemorrhage, hydrocephalus, extra-axial collection or mass lesion/mass effect. Vascular: No hyperdense  vessel or unexpected calcification. Skull: No osseous abnormality. Sinuses/Orbits: Visualized paranasal sinuses are clear. Visualized mastoid sinuses are clear. Visualized orbits demonstrate no focal abnormality. Other: None IMPRESSION: No acute intracranial pathology. Electronically Signed   By: Kathreen Devoid   On: 08/18/2019 12:19   DG Chest Portable 1 View  Result Date: 08/18/2019 CLINICAL DATA:  Syncope EXAM: PORTABLE CHEST 1 VIEW COMPARISON:  08/16/2019 FINDINGS: The heart size and mediastinal contours are within normal limits. Both lungs are clear. The visualized skeletal structures are unremarkable. IMPRESSION: No active disease. Electronically Signed   By: Davina Poke D.O.   On: 08/18/2019 12:19    ____________________________________________   PROCEDURES  Procedure(s) performed:   Procedures  None  ____________________________________________   INITIAL IMPRESSION / ASSESSMENT AND PLAN / ED COURSE  Pertinent labs & imaging results that were available during my care of the patient were reviewed by me and considered in my medical decision making (see chart for details).   Patient presents to the emergency department for evaluation after syncope, fatigue, and headache.  She is drowsy and appears to be  under the influence.  Her lab work is reassuring.  EKG reviewed with no acute ischemic findings or other arrhythmia to explain her syncope.  I did perform CT scan of the head which was normal as well as chest x-ray which was unremarkable.  Patient monitored on telemetry in the emergency department while work-up is pending with no acute arrhythmias or other ectopy.  Patient's urine tox screen comes back positive for multiple substances.  She does take Klonopin and is prescribed methadone but her tox screen is also positive for cocaine and THC.  Her neurologic exam is nonspecific.  I do not see stigmata of seizure.  She is afebrile.  I will discharge at this time and have given contact  information for a primary care physician for her to establish care.  Patient verbalizes to me understanding of her discharge instructions.  Discussed ED return precautions in detail.  ____________________________________________  FINAL CLINICAL IMPRESSION(S) / ED DIAGNOSES  Final diagnoses:  Syncope, unspecified syncope type  Polysubstance abuse ([place])    Note:  This document was prepared using Dragon voice recognition software and may include unintentional dictation errors.  Nanda Quinton, MD, [facility] Emergency Medicine    Javonda Suh, [facility], MD 08/18/19 1414

## 2019-08-18 NOTE — Discharge Instructions (Signed)
Substance Abuse Treatment Programs ° °Intensive Outpatient Programs °[facility]     °[address]      °[place], [place]                   °[phone]      ° °[facility] °[address] °[place], [place] °[phone] ° °[facility]     °(Inpatient and outpatient)     °[address]           °[phone]   ° °[facility] °[phone] (Suboxone and Methadone) ° °[address]      °[place], [place] [address]      °[phone]      ° °[address] °[place], [place] °[phone] ° °[facility] (Outpatient/Inpatient, [facility])    °(insurance only) [phone]      °       °[facility] (Groups & Residential) °[place], [place] °[phone] ° °   °[facility]     °[address]     °[place], [place]      °[phone]      ° °[facility] (for caregivers and family) °[address] °[place], [place] °336-299-4655 ° ° ° ° ° °Residential Treatment Programs °[facility]      °[address], [place], [place] [address]  °[phone]      ° °[facility] °[address] [place], [place] [address] °[phone] ° °[facility]        °[phone]      ° °[facility] °[phone] ° °[facility]             °[address]                                         °[place], [place]                                                °877-615-2722 or 336-784-9470                              ° °[facility] [place] °[address] °[place], [address] °[phone] ° °[facility]    °[address]      °[place], [place]     °[phone]      ° °[facility] °[address] °, [place] °[phone] ° °[facility]   °[address]     °[place], [place] [address]     °[phone]      °Admissions: 8am-3pm M-F ° °[facility] °[address] °[place],  [place] °336-227-7417 ° °[facility]: Residential Program (90 Days)   °[place], [place]      °336-725-8389 or [phone]    ° °[facility] °[place], [place] °(Walk in Hours over the weekend or by referral) ° °[facility] °[address], [place], [place] [address] °([phone] ° °Crisis Mobile: [facility]:  1-877-626-1772 (for crisis response 24 hours a day) °[facility] Hotline:      1-800-256-2452 °Outpatient Psychiatry and Counseling ° °[facility]: Mobile Crisis   Management 24 hours:  1-877-626-1772 ° °[facility] sliding scale fee and walk in schedule: M-F 8am-12pm/1pm-3pm °[address]  °[place], [place] [address] °[phone] ° °[facility] °[address] °[place], [place] [address] °[phone] ° °[facility] ([facility] known [facility])- new patient walk-in appointments available Monday - Friday 8am -3pm.          °[address] °[place], [place] [address] °336-676-6840 or crisis line- 336-676-6905 ° °[facility]/ Intensive Outpatient Therapy Program °[address] °[place], [place] [address] °336-832-9804 ° °[facility]                  °Crisis Services      °[phone]      °[address]     °[place], [place] [address]                ° °[facility]   °[facility] °[phone] °[address] °[place], [place] [address] ° ° °[facility]          °[address],  °[place], [place] [address]       °[phone] ° °[facility] °[address] °[place], [place] [address] °336-292-1510 ° °[facility]    °[address]    °[place], [place] [address]     °[phone]      ° °Casey McKinney, MD     °[address]     °[place] [address]     °[phone]     °  °[facility] °[address] °[place] [address] ° °[facility]     °[address]     °[place], [place]      °336-542-2076      ° °[facility] °Shamsher Ahluwalia, MD °[address] °[place], [place] [address] °[phone] ° °[facility]     °[address]     °[place], [place] [address]     °336-274-1237      ° °[facility] °[address] °[place], [place] [address] °336-854-4450 °Resources for Temporary Residential Assistance/Crisis Centers ° °DAY CENTERS °[facility] °M-F 8am-3pm   °[address] [place], [place] [address]   336-332-0824 °Services include: laundry, barbering, support groups, case management, phone  & computer access, showers, AA/NA mtgs, mental health/substance abuse nurse, job skills class, disability information, VA assistance, spiritual classes, etc.  ° °HOMELESS SHELTERS ° °[facility]     °[facility]   °[address], [place]     °[phone]       °       °[facility] (women and children)       °[address] °[place], [place] [address] °336-275-0820 °[email or web address] for application and process °Application Required ° °[facility]   °[address]    °[place] [address]     °[phone]       °             °[facility] °[address] °[place],  [address] °[phone] °336-235-0363(schedule application appt.) °Application Required ° °[facility] (women only)    °[address]     °[place],  [address]     °336-884-1039      °  Intake starts 6pm daily °Need valid ID, SSC, & Police report °[facility] [place] °[address] °[place], [place] °336-881-5420 °Application Required ° °[facility] (men only)     °[address]      °[place], [place]     °[phone]      ° °[facility] °(Pregnant women only) °[address] °[place], [place] °336-275-0206 ° °[facility]      °[address]      °[place], [place] [address]     °[phone]      °       °[facility] [place] [facility] °[address] °[place], [place] °336-723-1848 °90 day commitment/SA/Application process ° °[facility](men only)     °[address]     °[place], [place]     °[phone]       °Check-in at 7pm     °       °[facility] °[address] °[place], [place] [address] °336-248-6684 °Men/Women/Women and Children must be there by 7 pm ° °[facility] °[place], [place] °336-722-8721                ° °

## 2019-08-18 NOTE — ED Notes (Addendum)
Speech, slurred, asking for pain med. States has not had a dose of methadone for 3 days because she didn't the the money and was feeling too sick to go to the clinic

## 2019-08-18 NOTE — ED Triage Notes (Addendum)
States she was told by her OBGYN she needs a blood transfusion and she is in terrible shape. C.o dizziness, syncope x 2 over the past 2 weeks. Fatigue, body pain, loss of taste and loss of smell. States she has had multiple Covid test that have been negative. Abnormal vaginal bleeding. Abdnormal pap smear a year ago and she never followed up.

## 2020-05-05 DIAGNOSIS — Z789 Other specified health status: Secondary | ICD-10-CM | POA: Insufficient documentation

## 2020-05-05 DIAGNOSIS — Z1331 Encounter for screening for depression: Secondary | ICD-10-CM | POA: Insufficient documentation

## 2020-05-05 DIAGNOSIS — R102 Pelvic and perineal pain: Secondary | ICD-10-CM | POA: Insufficient documentation

## 2020-05-06 DIAGNOSIS — R7303 Prediabetes: Secondary | ICD-10-CM | POA: Insufficient documentation

## 2020-05-06 DIAGNOSIS — E782 Mixed hyperlipidemia: Secondary | ICD-10-CM | POA: Insufficient documentation

## 2020-05-06 DIAGNOSIS — Z8669 Personal history of other diseases of the nervous system and sense organs: Secondary | ICD-10-CM | POA: Insufficient documentation

## 2020-05-06 DIAGNOSIS — E7800 Pure hypercholesterolemia, unspecified: Secondary | ICD-10-CM | POA: Insufficient documentation

## 2020-08-05 ENCOUNTER — Telehealth (HOSPITAL_COMMUNITY): Payer: Self-pay | Admitting: Radiology

## 2020-08-05 NOTE — Telephone Encounter (Signed)
Error

## 2020-08-27 IMAGING — CT CT MAXILLOFACIAL W/ CM
3 series · 16 of 47 positions shown, 19 images · IV contrast (omnipaque)
Comparison: Head CT 03/29/2018

CLINICAL DATA: Redness and swelling over the right orbit with
concern for facial cellulitis

EXAM:
CT MAXILLOFACIAL WITH CONTRAST
TECHNIQUE: Multidetector CT imaging of the maxillofacial structures was
performed with intravenous contrast. Multiplanar CT image
reconstructions were also generated.
CONTRAST:  80mL OMNIPAQUE IOHEXOL 300 MG/ML  SOLN

[Series 2: max soft · axial · 0.34mm/px · z∈[-239,-95]mm · 10 of 84 slices shown, 13 images]
[im 6/84  brain]
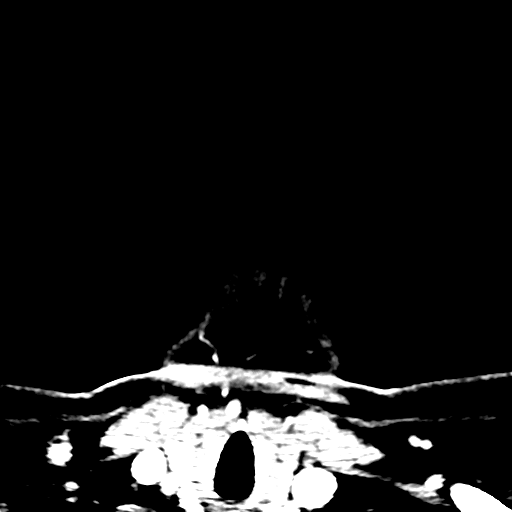
[im 6/84  bone]
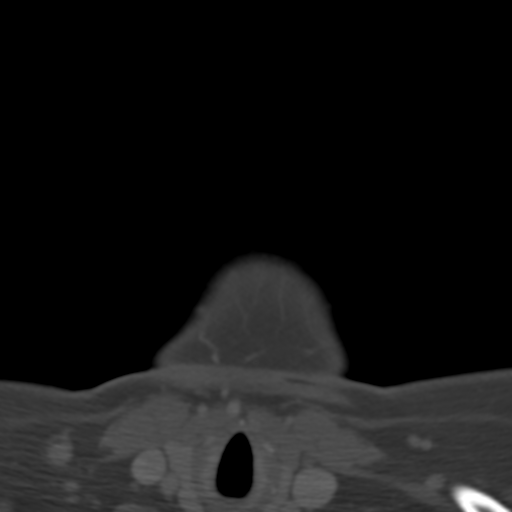
[im 15/84  bone]
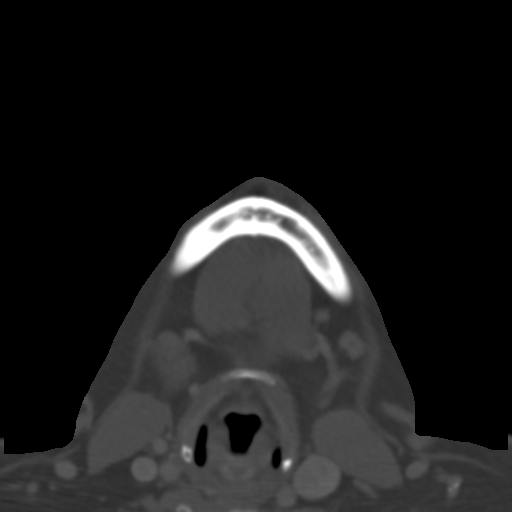
[im 23/84  bone]
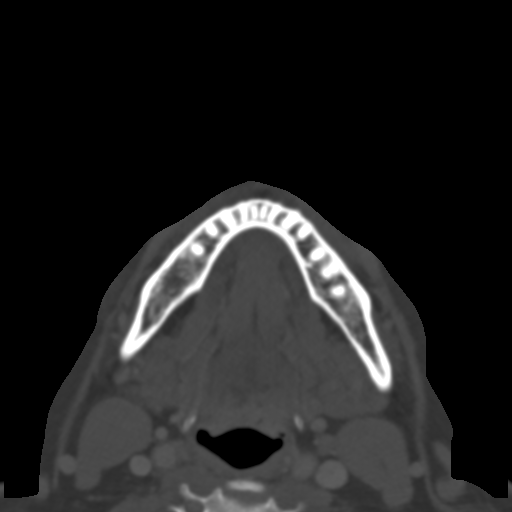
[im 29/84  bone]
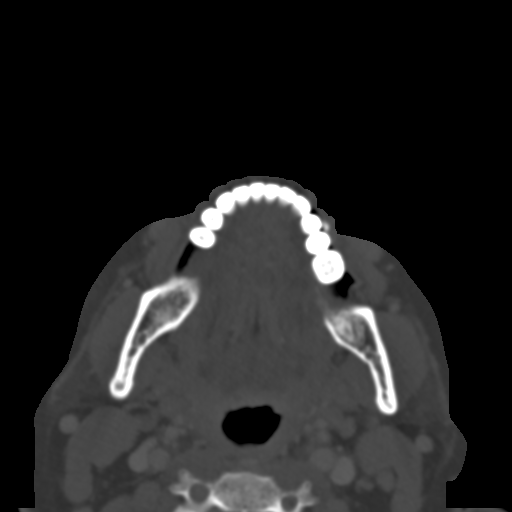
[im 38/84  brain]
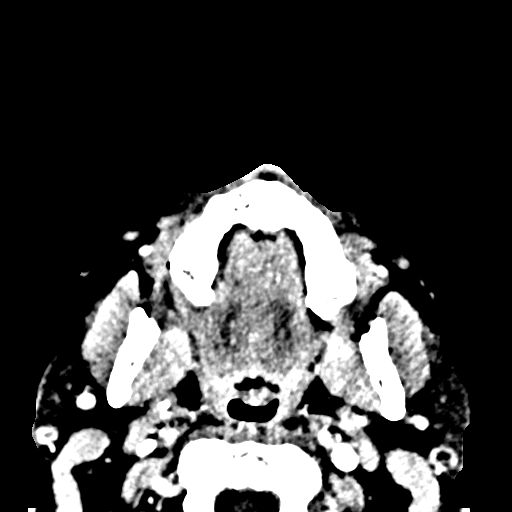
[im 38/84  bone]
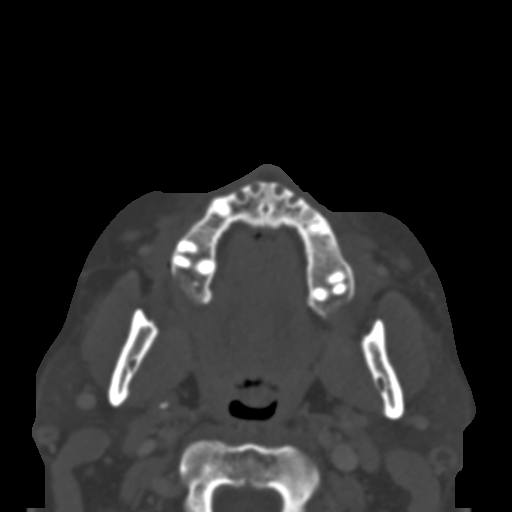
[im 46/84  bone]
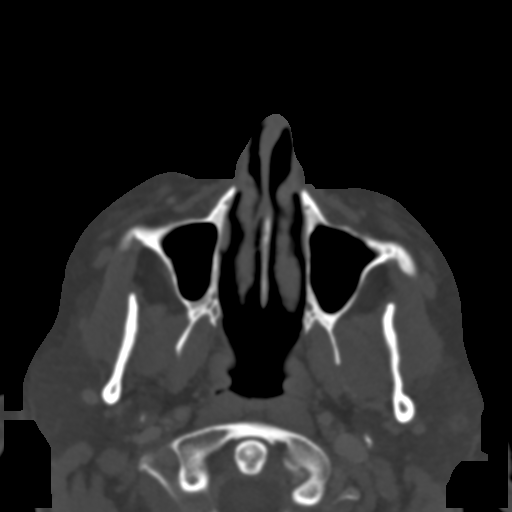
[im 55/84  bone]
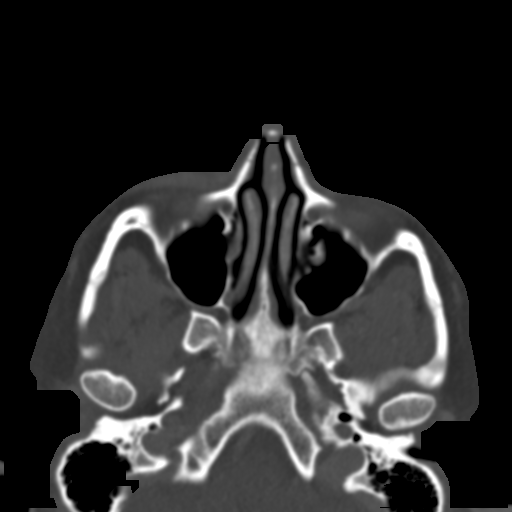
[im 63/84  bone]
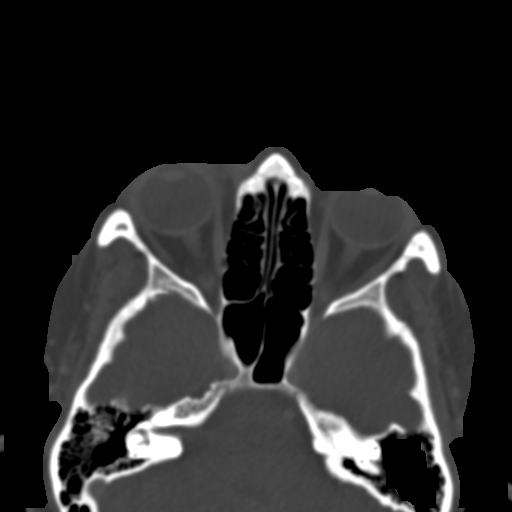
[im 69/84  brain]
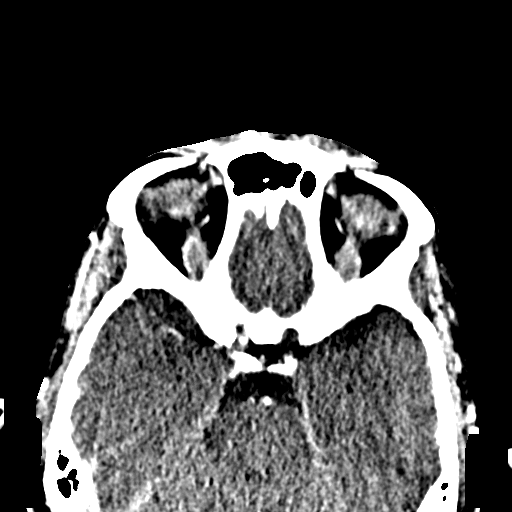
[im 69/84  bone]
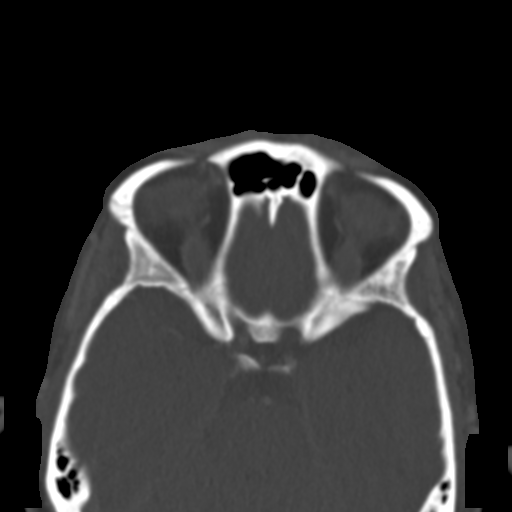
[im 78/84  bone]
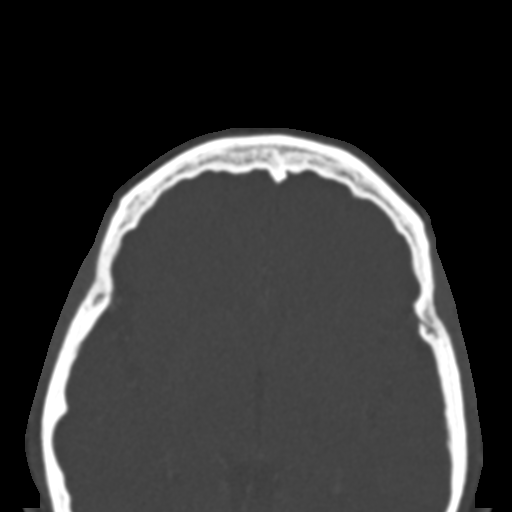

[Series 4: coronal soft · coronal · 0.34mm/px · 3 of 77 slices shown]
[im 26/77  bone]
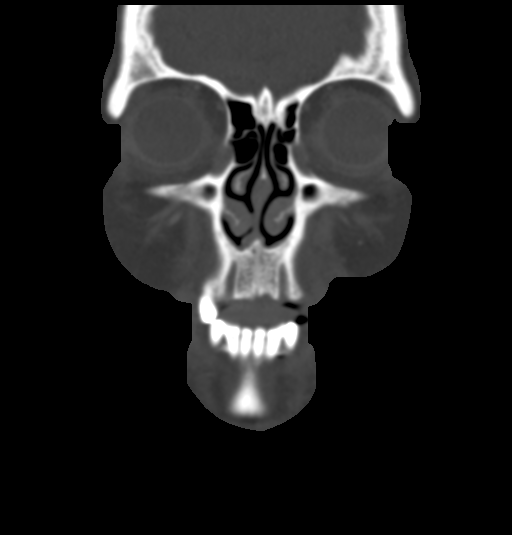
[im 34/77  bone]
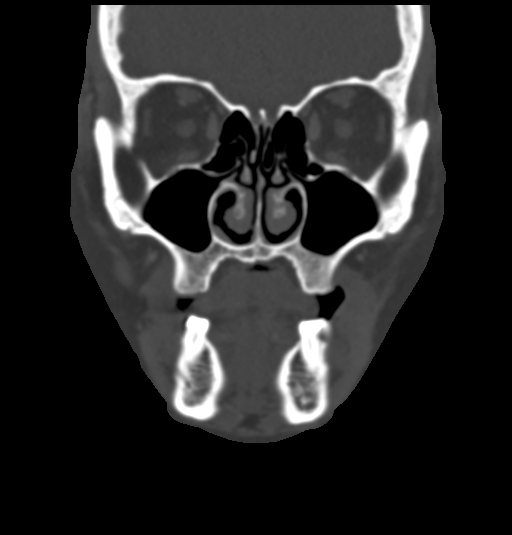
[im 43/77  bone]
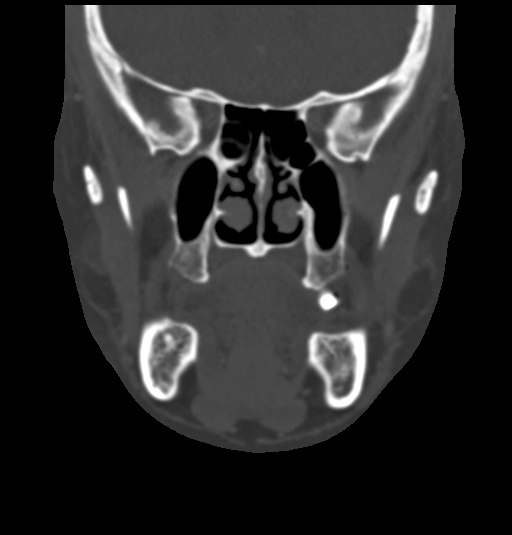

[Series 5: sagittal soft · sagittal · 0.34mm/px · 3 of 88 slices shown]
[im 30/88  bone]
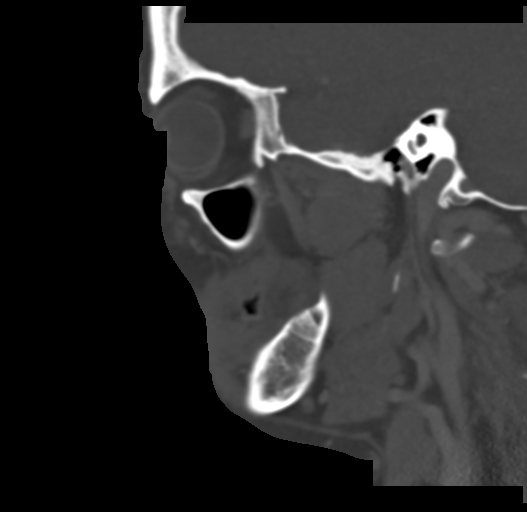
[im 44/88  bone]
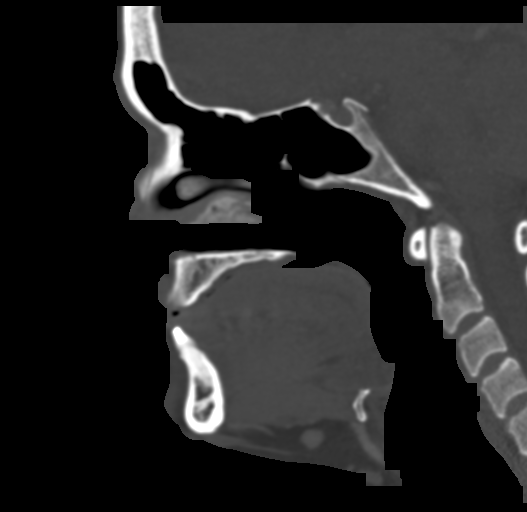
[im 59/88  bone]
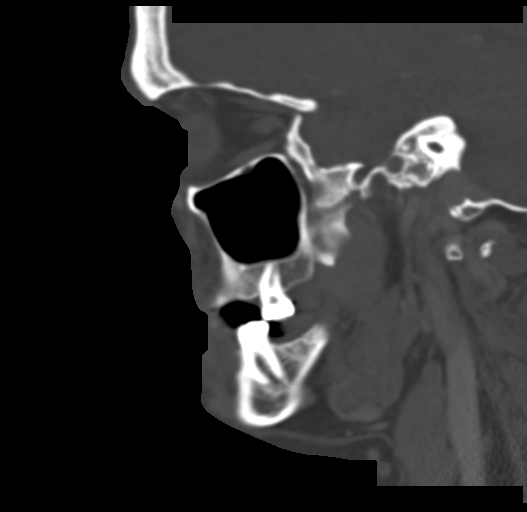

[16 of 47 positions shown; findings below may reference images not displayed]

FINDINGS: Osseous: Negative for fracture or destructive process. Missing upper
incisors and left canine which were likely extracted recently.

Orbits: Right preseptal swelling that is mild in centered on the
lids. No postseptal edema or abscess. Superior ophthalmic veins are
enhancing. Unremarkable appearance of the globes, extraocular
muscles, and lacrimal glands.

Sinuses: Clear

Soft tissues: As above

Limited intracranial: Negative
IMPRESSION: Right preseptal swelling.  No postseptal inflammation or abscess.

## 2020-12-02 IMAGING — DX DG CHEST 1V PORT
1 series · 1 of 1 positions shown · non-contrast
Comparison: 08/16/2019

CLINICAL DATA: Syncope

EXAM:
PORTABLE CHEST 1 VIEW

[chest ap]
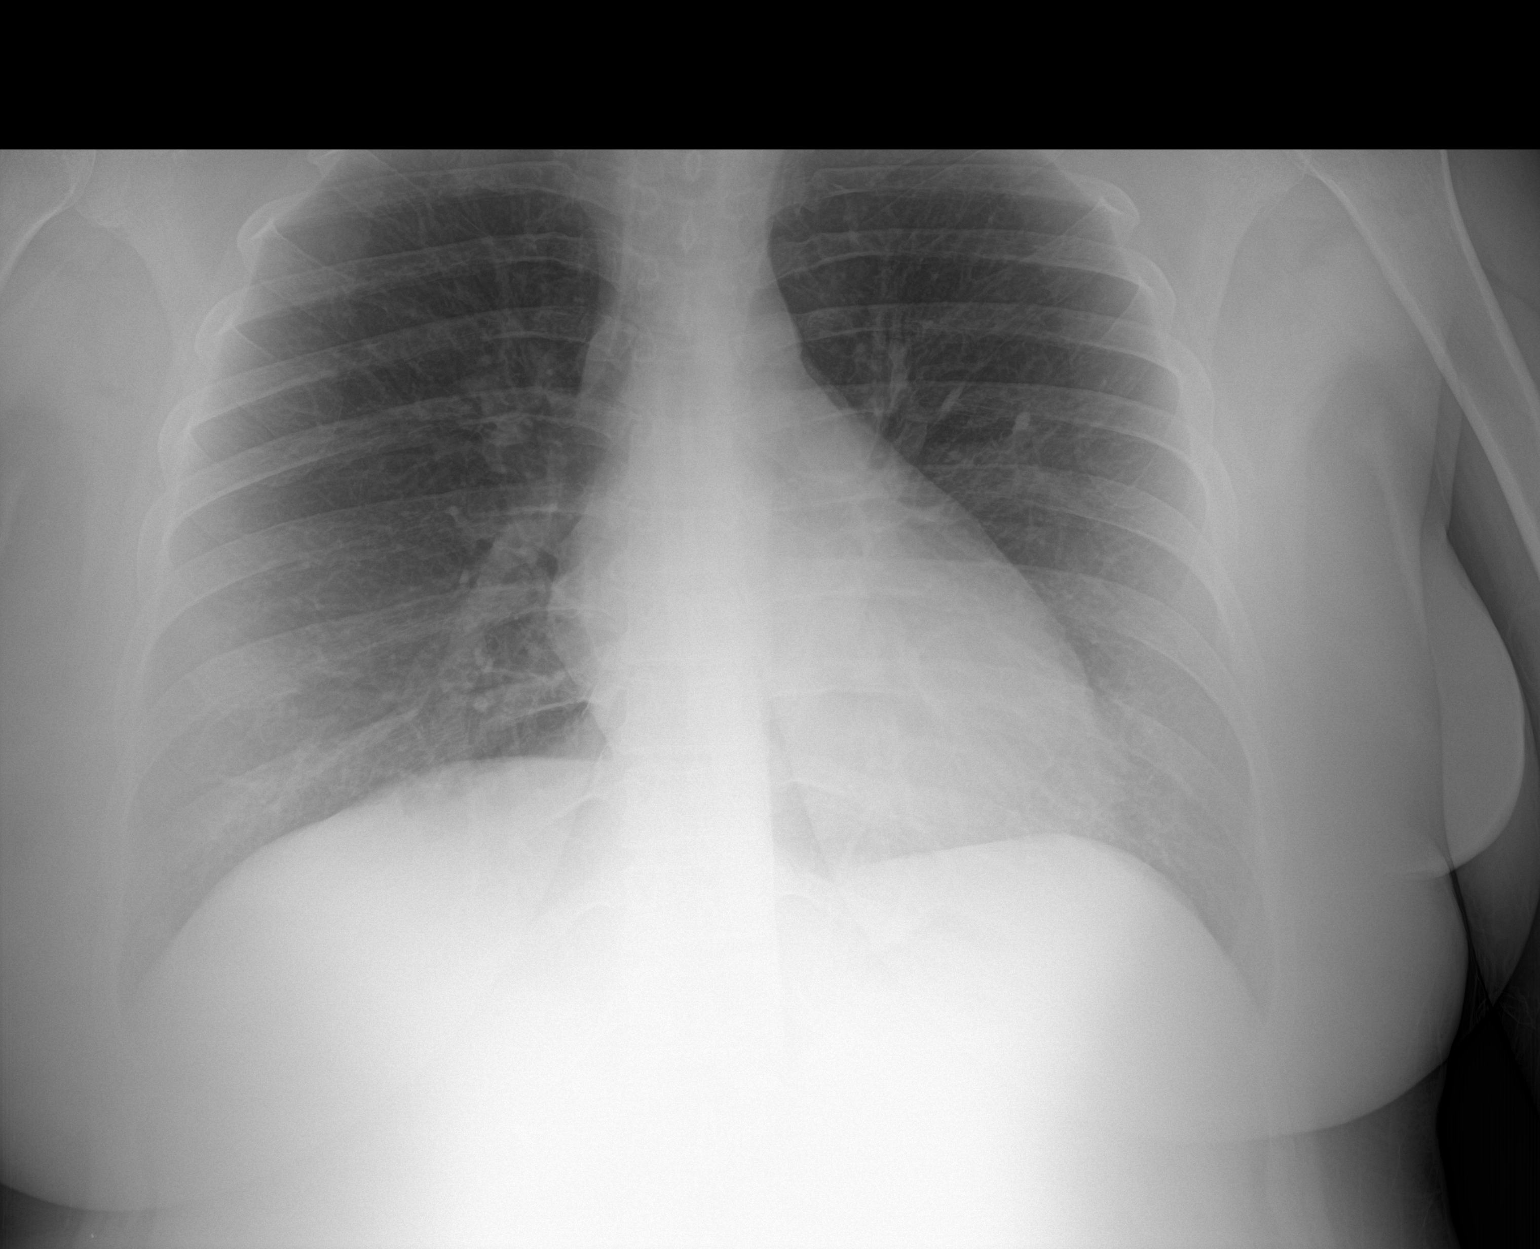

[1 of 1 positions shown; findings below may reference images not displayed]

FINDINGS: The heart size and mediastinal contours are within normal limits.
Both lungs are clear. The visualized skeletal structures are
unremarkable.
IMPRESSION: No active disease.

## 2021-12-11 ENCOUNTER — Encounter: Payer: Self-pay | Admitting: Emergency Medicine

## 2021-12-11 ENCOUNTER — Ambulatory Visit: Payer: Medicaid Other | Admitting: Emergency Medicine

## 2021-12-11 VITALS — BP 132/80 | HR 111 | Ht 64.0 in | Wt 186.8 lb

## 2021-12-11 DIAGNOSIS — F99 Mental disorder, not otherwise specified: Secondary | ICD-10-CM

## 2021-12-11 DIAGNOSIS — F1911 Other psychoactive substance abuse, in remission: Secondary | ICD-10-CM

## 2021-12-11 NOTE — Patient Instructions (Signed)

## 2021-12-11 NOTE — Progress Notes (Signed)
Casey Mitchell 41 y.o.   Chief Complaint  Patient presents with   New Patient (Initial Visit)    HISTORY OF PRESENT ILLNESS: This is a 41 y.o. female first visit to this office, looking to establish care with PCP. Medical records available reviewed. Patient has history of bipolar disorder, OCD, schizophrenia, chronic depression. Also told she may have lupus and prediabetes.  History of seizures as well. States she is not taking any medications at present time. Vague about some of her past medical history. Chronic smoker.  HPI   Prior to Admission medications   Medication Sig Start Date End Date Taking? Authorizing Provider  chlorhexidine (PERIDEX) 0.12 % solution 15 mLs by Mouth Rinse route as directed. Rinse mouth with 15 ml for 30 seconds. Both in the Am and the evening after brushing teeth. Spit out after rinsing 03/12/19  Yes [provider]  dicyclomine (BENTYL) 20 MG tablet Take 20 mg by mouth 3 (three) times daily before meals.   Yes [provider]  methadone (DOLOPHINE) 10 MG tablet Take 19 tablets (190 mg total) by mouth daily. 05/17/19  Yes Regalado, Belkys A, MD  naloxegol oxalate (MOVANTIK) 12.5 MG TABS tablet Take by mouth. 10/17/21  Yes [provider]  acetaminophen (TYLENOL) 500 MG tablet Take by mouth.    [provider]  clonazePAM (KLONOPIN) 1 MG tablet Take 1 mg by mouth 3 (three) times daily. Patient not taking: Reported on 12/11/2021    [provider]  ferrous sulfate 324 MG TBEC ferrous sulfate 324 mg (65 mg iron) tablet,delayed release  TAKE 1 TABLET BY MOUTH ONCE DAILY FOR DAYS    [provider]  HYDROcodone-acetaminophen (NORCO/VICODIN) 5-325 MG per tablet Take 1 tablet by mouth every 6 (six) hours as needed for moderate pain. Patient not taking: Reported on 12/11/2021    [provider]  ibuprofen (ADVIL) 200 MG tablet Take by mouth.    [provider]  levETIRAcetam (KEPPRA) 500 MG  tablet Take 500 mg by mouth 2 (two) times daily. Patient not taking: Reported on 12/11/2021 05/15/18   [provider]  neomycin-polymyxin b-dexamethasone (MAXITROL) 3.5-10000-0.1 OINT Place 1 application into the right eye at bedtime. Patient not taking: Reported on 12/11/2021 05/17/19   Regalado, Jerald Kief A, MD  omeprazole (PRILOSEC) 20 MG capsule Take 20 mg by mouth daily. 10/07/21   [provider]  polyethylene glycol (MIRALAX / GLYCOLAX) 17 g packet Take 17 g by mouth daily. Patient not taking: Reported on 12/11/2021 05/17/19   Niel Hummer A, MD    Allergies  Allergen Reactions   Aripiprazole Other (See Comments)    Anger Pt states it makes her angry Unknown Pt states it makes her angry Unknown    Olanzapine Other (See Comments)    Seizures  Seizures  Seizures     Prochlorperazine Other (See Comments)    tremors   Sertraline Nausea And Vomiting and Other (See Comments)    Pt states it makes her ill Unknown Pt states it makes her ill     Patient Active Problem List   Diagnosis Date Noted   History of epilepsy 05/06/2020   Mixed hyperlipidemia 05/06/2020   Prediabetes 05/06/2020   Pure hypercholesterolemia 05/06/2020   Pelvic pain in female 05/05/2020   Positive depression screening 05/05/2020   Relies on tubal ligation as primary birth control method 05/05/2020   Pain of upper abdomen 06/30/2019   Rectal bleeding 06/30/2019   S/P ORIF (open reduction internal fixation) fracture 06/30/2018  Vitamin D deficiency 04/14/3006   Umbilical hernia without obstruction and without gangrene 06/06/2018   Pneumonia 05/11/2018   Dysmenorrhea 04/20/2018   Menorrhagia with regular cycle 04/20/2018   Acute superficial gastritis without hemorrhage 09/03/2017   Sjogren's disease ([place]) 08/30/2017   Sprain of tibiofibular ligament of right ankle 07/31/2017   Chronic cholecystitis 07/11/2017   Allergy to environmental factors 07/03/2017   Anxiety 07/03/2017    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   Fibromyalgia 07/03/2017   Fatty liver 07/03/2017   Ear problem 07/03/2017   Gastroesophageal reflux disease 07/03/2017   Seizure ([place]) 07/03/2017   Calculus of kidney 01/02/2017   Closed fracture of right tibial plafond without fibula involvement 01/02/2017   History of pancreatitis 01/02/2017   Hypokalemia 01/02/2017   Right tibial fracture 01/01/2017   Uterine fibroid 01/01/2017   Benzodiazepine dependence ([place]) 04/29/2016   Cannabis abuse 04/29/2016   Opioid dependence on maintenance agonist therapy, no symptoms ([place]) 04/29/2016   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   Acute carpal tunnel syndrome 02/24/2014   Crushing injury of hand 02/24/2014   Left hand pain 11/28/2013   Schizoaffective disorder, depressive type ([place]) 06/30/2013   Attention deficit disorder 06/30/2013   Low back pain 12/29/2012   Panic disorder 12/29/2012   Body mass index (BMI) 35.0-35.9, adult 09/06/2012   Febrile disorder 09/06/2012   Tobacco use 09/06/2012    Past Medical History:  Diagnosis Date   Anxiety    Bipolar affective disorder ([place])    Depression    Drug addiction ([place])    Gastritis    High cholesterol    Lupus ([place])     Past Surgical History:  Procedure Laterality Date   CESAREAN SECTION     TONSILLECTOMY     TUBAL LIGATION      Social History   Socioeconomic History   Marital status: Married    Spouse name: Not on file   Number of children: Not on file   Years of education: Not on file   Highest education level: Not on file  Occupational History   Not on file  Tobacco Use   Smoking status: Every Day   Smokeless tobacco: Current  Vaping Use   Vaping Use: Never used  Substance and Sexual Activity   Alcohol use: No   Drug use: No   Sexual activity: Not on file  Other Topics Concern   Not on file  Social History Narrative   Not on file   Social Determinants of Health   Financial Resource Strain: Not on file  Food  Insecurity: Not on file  Transportation Needs: Not on file  Physical Activity: Not on file  Stress: Not on file  Social Connections: Not on file  Intimate Partner Violence: Not on file    No family history on file.   Review of Systems  Constitutional: Negative.  Negative for chills and fever.  HENT:  Negative for congestion and sore throat.   Respiratory: Negative.  Negative for cough and shortness of breath.   Cardiovascular:  Negative for chest pain and palpitations.  Gastrointestinal:  Negative for abdominal pain, nausea and vomiting.  Musculoskeletal:  Positive for back pain.  Skin: Negative.  Negative for rash.  Neurological:  Negative for dizziness and headaches.  Psychiatric/Behavioral:  Positive for substance abuse. Negative for suicidal ideas.   All other systems reviewed and are negative.  Today's Vitals   12/11/21 1043  BP: 132/80  Pulse: (!) 111  SpO2: 99%  Weight: 186 lb 12.8  oz (84.7 kg)  Height: '5\' 4"'$  (1.626 m)   Body mass index is 32.06 kg/m.  Physical Exam Vitals reviewed.  Constitutional:      Appearance: Normal appearance.  HENT:     Head: Normocephalic.  Eyes:     Extraocular Movements: Extraocular movements intact.  Cardiovascular:     Rate and Rhythm: Normal rate.  Pulmonary:     Effort: Pulmonary effort is normal.  Skin:    General: Skin is warm and dry.  Neurological:     Mental Status: She is alert and oriented to person, place, and time.  Psychiatric:        Behavior: Behavior normal.     ASSESSMENT & PLAN: I will not be accepting Ms. Friis as a new patient given her complex medical/psychiatric history.  I do not feel comfortable or capably trained to be her primary care physician.  I also do not feel she is being very honest about her past medical history and conditions which makes it very difficult to establish a relationship that needs to be based in trust.  It is in her best interest to find a primary care physician who feels more  comfortable handling complex psychiatric conditions and who will be able to give her the time and attention she needs.  I am also not accepting new patients at the present time given my present patient volume load.  Problem List Items Addressed This Visit   None Visit Diagnoses     Complex psychiatric condition    -  Primary   History of substance abuse [facility])          Patient Instructions  Health Maintenance, Female Adopting a healthy lifestyle and getting preventive care are important in promoting health and wellness. Ask your health care provider about: The right schedule for you to have regular tests and exams. Things you can do on your own to prevent diseases and keep yourself healthy. What should I know about diet, weight, and exercise? Eat a healthy diet  Eat a diet that includes plenty of vegetables, fruits, low-fat dairy products, and lean protein. Do not eat a lot of foods that are high in solid fats, added sugars, or sodium. Maintain a healthy weight Body mass index (BMI) is used to identify weight problems. It estimates body fat based on height and weight. Your health care provider can help determine your BMI and help you achieve or maintain a healthy weight. Get regular exercise Get regular exercise. This is one of the most important things you can do for your health. Most adults should: Exercise for at least 150 minutes each week. The exercise should increase your heart rate and make you sweat (moderate-intensity exercise). Do strengthening exercises at least twice a week. This is in addition to the moderate-intensity exercise. Spend less time sitting. Even light physical activity can be beneficial. Watch cholesterol and blood lipids Have your blood tested for lipids and cholesterol at 42 years of age, then have this test every 5 years. Have your cholesterol levels checked more often if: Your lipid or cholesterol levels are high. You are older than 42 years of age. You  are at high risk for heart disease. What should I know about cancer screening? Depending on your health history and family history, you may need to have cancer screening at various ages. This may include screening for: Breast cancer. Cervical cancer. Colorectal cancer. Skin cancer. Lung cancer. What should I know about heart disease, diabetes, and high blood pressure?  Blood pressure and heart disease High blood pressure causes heart disease and increases the risk of stroke. This is more likely to develop in people who have high blood pressure readings or are overweight. Have your blood pressure checked: Every 3-5 years if you are 89-55 years of age. Every year if you are 75 years old or older. Diabetes Have regular diabetes screenings. This checks your fasting blood sugar level. Have the screening done: Once every three years after age 70 if you are at a normal weight and have a low risk for diabetes. More often and at a younger age if you are overweight or have a high risk for diabetes. What should I know about preventing infection? Hepatitis B If you have a higher risk for hepatitis B, you should be screened for this virus. Talk with your health care provider to find out if you are at risk for hepatitis B infection. Hepatitis C Testing is recommended for: Everyone born from [birth year] through 1966. Anyone with known risk factors for hepatitis C. Sexually transmitted infections (STIs) Get screened for STIs, including gonorrhea and chlamydia, if: You are sexually active and are younger than 42 years of age. You are older than 42 years of age and your health care provider tells you that you are at risk for this type of infection. Your sexual activity has changed since you were last screened, and you are at increased risk for chlamydia or gonorrhea. Ask your health care provider if you are at risk. Ask your health care provider about whether you are at high risk for HIV. Your health care  provider may recommend a prescription medicine to help prevent HIV infection. If you choose to take medicine to prevent HIV, you should first get tested for HIV. You should then be tested every 3 months for as long as you are taking the medicine. Pregnancy If you are about to stop having your period (premenopausal) and you may become pregnant, seek counseling before you get pregnant. Take 400 to 800 micrograms (mcg) of folic acid every day if you become pregnant. Ask for birth control (contraception) if you want to prevent pregnancy. Osteoporosis and menopause Osteoporosis is a disease in which the bones lose minerals and strength with aging. This can result in bone fractures. If you are 62 years old or older, or if you are at risk for osteoporosis and fractures, ask your health care provider if you should: Be screened for bone loss. Take a calcium or vitamin D supplement to lower your risk of fractures. Be given hormone replacement therapy (HRT) to treat symptoms of menopause. Follow these instructions at home: Alcohol use Do not drink alcohol if: Your health care provider tells you not to drink. You are pregnant, may be pregnant, or are planning to become pregnant. If you drink alcohol: Limit how much you have to: 0-1 drink a day. Know how much alcohol is in your drink. In the [place] one drink equals one 12 oz bottle of beer (355 mL), one 5 oz glass of wine (148 mL), or one 1 oz glass of hard liquor (44 mL). Lifestyle Do not use any products that contain nicotine or tobacco. These products include cigarettes, chewing tobacco, and vaping devices, such as e-cigarettes. If you need help quitting, ask your health care provider. Do not use street drugs. Do not share needles. Ask your health care provider for help if you need support or information about quitting drugs. General instructions Schedule regular health, dental, and eye exams. Stay  current with your vaccines. Tell your health care  provider if: You often feel depressed. You have ever been abused or do not feel safe at home. Summary Adopting a healthy lifestyle and getting preventive care are important in promoting health and wellness. Follow your health care provider's instructions about healthy diet, exercising, and getting tested or screened for diseases. Follow your health care provider's instructions on monitoring your cholesterol and blood pressure. This information is not intended to replace advice given to you by your health care provider. Make sure you discuss any questions you have with your health care provider. Document Revised: 11/28/2020 Document Reviewed: 11/28/2020 Elsevier Patient Education  [place], MD [facility]

## 2022-01-09 ENCOUNTER — Ambulatory Visit: Payer: Medicaid Other | Admitting: Family Medicine

## 2022-01-24 ENCOUNTER — Encounter: Payer: Self-pay | Admitting: Orthopedic Surgery

## 2022-01-25 ENCOUNTER — Ambulatory Visit: Payer: Self-pay | Admitting: *Deleted

## 2022-01-25 ENCOUNTER — Ambulatory Visit: Payer: Medicaid Other | Admitting: Orthopedic Surgery

## 2022-01-25 ENCOUNTER — Encounter: Payer: Self-pay | Admitting: Orthopedic Surgery

## 2022-01-25 VITALS — BP 112/74 | HR 74 | Temp 97.5°F | Resp 18 | Ht 64.0 in | Wt 178.0 lb

## 2022-01-25 DIAGNOSIS — F1911 Other psychoactive substance abuse, in remission: Secondary | ICD-10-CM

## 2022-01-25 DIAGNOSIS — F99 Mental disorder, not otherwise specified: Secondary | ICD-10-CM

## 2022-01-25 DIAGNOSIS — B1920 Unspecified viral hepatitis C without hepatic coma: Secondary | ICD-10-CM | POA: Insufficient documentation

## 2022-01-25 DIAGNOSIS — B182 Chronic viral hepatitis C: Secondary | ICD-10-CM

## 2022-01-25 NOTE — Telephone Encounter (Signed)
Pt's mother calling, pt present. States they have been told "Again and again they can't help my daughter, too complicated." States just saw A. Cleophas Dunker as PCP who advised [employer]. Advised to follow that disposition. Also provided number to Patient Experience. States will reach out to [employer] as directed. Called on [employer].

## 2022-01-25 NOTE — Telephone Encounter (Signed)
Reason for Disposition  Nursing judgment or information in reference  Answer Assessment - Initial Assessment Questions 1. REASON FOR CALL: "What is your main concern right now?"     "Getting run around, too complicated."  Protocols used: No Guideline Available-A-AH

## 2022-01-25 NOTE — Patient Instructions (Addendum)
[facility], [place], [place] [address] [phone]  [facility] [id number]  [phone] Open ? Closes 5:30?PM

## 2022-01-25 NOTE — Progress Notes (Signed)
Careteam: Patient Care Team: Yvonna Alanis, NP as PCP - General (Adult Health Nurse Practitioner)  Seen by: Windell Moulding, AGNP-C  PLACE OF SERVICE:  [place] Directive information Does Patient Have a Medical Advance Directive?: No, Would patient like information on creating a medical advance directive?: No - Patient declined  Allergies  Allergen Reactions   Aripiprazole Other (See Comments)    Anger Pt states it makes her angry Unknown Pt states it makes her angry Unknown    Olanzapine Other (See Comments)    Seizures  Seizures  Seizures     Prochlorperazine Other (See Comments)    tremors   Sertraline Nausea And Vomiting and Other (See Comments)    Pt states it makes her ill Unknown Pt states it makes her ill    Sertraline Hcl Nausea And Vomiting    Chief Complaint  Patient presents with   [place] patient here to establish care, pill bottles at initial appointment.      HPI: Patient is a 41 y.o. female seen today to establish at [facility].   Medical records reviewed. Her past medical history includes attention deficit disorder, depression, anxiety, schizophrenia, OCD, substance abuse with intentional overdose and abdominal pain. She reports being told she has Lupus and fibromyalgia in past. She denies being on any medications at this time. She has been evaluated by the ED many times and GI specialist for chronic abdominal pain. Discussed recent ED visit related to abdominal pain. Urine drug screen performed. She denied using any drugs except marijuana. Urine drug screen positive for meth 01/18/2022. I asked her if she uses meth and she stated "no but thinks her  marijuana may be laced with it." She admits to using marijuana due to chronic abdominal pain. I asked her if she has ever been followed by a psychiatrist or substance abuse counselor/program and she said "no." When asked about attempted suicide in 2019, she responded " it was  another family member- not her." I do not think she was being truthful with many of her responses. I explained to the patient that I am unable to accept her as a new patient due to the complexity of her psychiatric issues and substance use. She became very defensive and upset with me. She stated " no one will help me." I recommended she be evaluated by [facility] or [facility] at this time. She was accepting of the information given.    Review of Systems:  ROS unable to perform  Past Medical History:  Diagnosis Date   Anxiety    Bipolar affective disorder ([place])    Depression    Drug addiction ([place])    Gastritis    High cholesterol    Lupus ([place])    Past Surgical History:  Procedure Laterality Date   CESAREAN SECTION     TONSILLECTOMY     TUBAL LIGATION     Social History:   reports that she has been smoking. She uses smokeless tobacco. She reports that she does not drink alcohol and does not use drugs.  History reviewed. No pertinent family history.  Medications: Patient's Medications  New Prescriptions   No medications on file  Previous Medications   ACETAMINOPHEN (TYLENOL) 500 MG TABLET    Take by mouth.   CHLORHEXIDINE (PERIDEX) 0.12 % SOLUTION    15 mLs by Mouth Rinse route as directed. Rinse mouth with 15 ml for 30  seconds. Both in the Am and the evening after brushing teeth. Spit out after rinsing   CLONAZEPAM (KLONOPIN) 1 MG TABLET    Take 1 mg by mouth 3 (three) times daily.   DICYCLOMINE (BENTYL) 20 MG TABLET    Take 20 mg by mouth 3 (three) times daily before meals.   FERROUS SULFATE 324 MG TBEC    ferrous sulfate 324 mg (65 mg iron) tablet,delayed release  TAKE 1 TABLET BY MOUTH ONCE DAILY FOR DAYS   HYDROCODONE-ACETAMINOPHEN (NORCO/VICODIN) 5-325 MG PER TABLET    Take 1 tablet by mouth every 6 (six) hours as needed for moderate pain.   IBUPROFEN (ADVIL) 200 MG TABLET    Take by mouth.   LEVETIRACETAM (KEPPRA) 500 MG TABLET     Take 500 mg by mouth 2 (two) times daily.   METHADONE (DOLOPHINE) 10 MG TABLET    Take 19 tablets (190 mg total) by mouth daily.   NALOXEGOL OXALATE (MOVANTIK) 12.5 MG TABS TABLET    Take by mouth.   NEOMYCIN-POLYMYXIN B-DEXAMETHASONE (MAXITROL) 3.5-10000-0.1 OINT    Place 1 application into the right eye at bedtime.   OMEPRAZOLE (PRILOSEC) 20 MG CAPSULE    Take 20 mg by mouth daily.   POLYETHYLENE GLYCOL (MIRALAX / GLYCOLAX) 17 G PACKET    Take 17 g by mouth daily.  Modified Medications   No medications on file  Discontinued Medications   No medications on file    Physical Exam:  There were no vitals filed for this visit. There is no height or weight on file to calculate BMI. Wt Readings from Last 3 Encounters:  12/11/21 186 lb 12.8 oz (84.7 kg)  08/18/19 180 lb (81.6 kg)  08/16/19 180 lb (81.6 kg)    Physical Examunable to perform  Labs reviewed: Basic Metabolic Panel: No results for input(s): "NA", "K", "CL", "CO2", "GLUCOSE", "BUN", "CREATININE", "CALCIUM", "MG", "PHOS", "TSH" in the last 8760 hours. Liver Function Tests: No results for input(s): "AST", "ALT", "ALKPHOS", "BILITOT", "PROT", "ALBUMIN" in the last 8760 hours. No results for input(s): "LIPASE", "AMYLASE" in the last 8760 hours. No results for input(s): "AMMONIA" in the last 8760 hours. CBC: No results for input(s): "WBC", "NEUTROABS", "HGB", "HCT", "MCV", "PLT" in the last 8760 hours. Lipid Panel: No results for input(s): "CHOL", "HDL", "LDLCALC", "TRIG", "CHOLHDL", "LDLDIRECT" in the last 8760 hours. TSH: No results for input(s): "TSH" in the last 8760 hours. A1C: No results found for: "HGBA1C"   Assessment/Plan 1. Complex psychiatric condition - H/o attention deficit disorder, depression, anxiety, schizophrenia, OCD, substance abuse - recommend [facility] or [facility] for management over PCP  2. History of substance abuse ([place]) - admits to using  marijuana, denied meth use today - tested positive for meth 01/18/2022 - see above  Total time: 21 minutes.   Next appt:  [place], [facility] [phone]

## 2022-05-22 ENCOUNTER — Telehealth: Payer: Self-pay

## 2022-05-22 NOTE — Telephone Encounter (Signed)
Transition Care Management Follow-up Telephone Call Date of discharge and from where: [facility], 05/21/2022 How have you been since you were released from the hospital? ok Any questions or concerns? No  Items Reviewed: Did the pt receive and understand the discharge instructions provided? Yes  Medications obtained and verified? Yes  Other? No  Any new allergies since your discharge? No  Dietary orders reviewed? No Do you have support at home? Yes   Home Care and Equipment/Supplies: Were home health services ordered? not applicable If so, what is the name of the agency? N/a  Has the agency set up a time to come to the patient's home? no Were any new equipment or medical supplies ordered?  No What is the name of the medical supply agency? N/A Were you able to get the supplies/equipment? not applicable Do you have any questions related to the use of the equipment or supplies? No  Functional Questionnaire: (I = Independent and D = Dependent) ADLs: I  Bathing/Dressing- I  Meal Prep- I  Eating- I  Maintaining continence- I  Transferring/Ambulation- I  Managing Meds- I  Follow up appointments reviewed:  PCP Hospital f/u appt confirmed? Yes  Scheduled to see Yvonna Alanis, NP on 05/24/2022 @ 1000AM. [facility] f/u appt confirmed? No  Scheduled to see N/A on N/A @ N/A. Are transportation arrangements needed? No  If their condition worsens, is the pt aware to call PCP or go to the Emergency Dept.? Yes Was the patient provided with contact information for the PCP's office or ED? Yes Was to pt encouraged to call back with questions or concerns? Yes

## 2022-05-23 ENCOUNTER — Telehealth: Payer: Self-pay

## 2022-05-23 NOTE — Telephone Encounter (Signed)
Called patient for PCP update unable to reach patient at this time.

## 2022-05-24 ENCOUNTER — Ambulatory Visit: Payer: Medicaid Other | Admitting: Orthopedic Surgery

## 2022-05-24 ENCOUNTER — Encounter: Payer: Self-pay | Admitting: Orthopedic Surgery

## 2022-05-24 VITALS — BP 110/62 | HR 77 | Temp 97.1°F | Ht 64.0 in | Wt 173.2 lb

## 2022-05-24 DIAGNOSIS — B86 Scabies: Secondary | ICD-10-CM

## 2022-05-24 MED ORDER — PERMETHRIN 5 % EX CREA: 5.0000 | TOPICAL_CREAM | Freq: Once | CUTANEOUS | 1 refills | Status: DC

## 2022-05-24 MED ORDER — PERMETHRIN 5 % EX CREA: 1.0000 | TOPICAL_CREAM | Freq: Once | CUTANEOUS | 0 refills | Status: AC

## 2022-05-24 NOTE — Progress Notes (Signed)
Careteam: No care team member to display  Seen by: Windell Moulding, AGNP-C  PLACE OF SERVICE:  [facility] Directive information    Allergies  Allergen Reactions   Aripiprazole Other (See Comments)    Anger Pt states it makes her angry Unknown Pt states it makes her angry Unknown    Olanzapine Other (See Comments)    Seizures  Seizures  Seizures     Prochlorperazine Other (See Comments)    tremors   Sertraline Nausea And Vomiting and Other (See Comments)    Pt states it makes her ill Unknown Pt states it makes her ill    Sertraline Hcl Nausea And Vomiting    Chief Complaint  Patient presents with   Acute Visit    Patient presents today for rash follow-up that appeared on 05/20/22. She went to the ED at [facility] and they prescribed premethrin 5 % cream.     HPI: Patient is a 41 y.o. female seen today for f/u s/p ED visit 05/20/2022.   Reports having small bites marks to stomach and extremities x 5 days. She presented to the [facility] ED 10/29 and was prescribed permethrin cream for scabies. She reports improved rash after using cream. Admits to cleaning home after ED visit. Rash has not subsided. She has new bite marks to stomach and extremities today.   During our encounter she used strong verbal language towards me and left exam room and I was unable to examine her. She stated" you are going to get a bad review."   Review of Systems:  ROS  Past Medical History:  Diagnosis Date   Anxiety    Bipolar affective disorder ([place])    Depression    Drug addiction ([place])    Gastritis    Hepatitis-C    High cholesterol    Lupus ([place])    Past Surgical History:  Procedure Laterality Date   CESAREAN SECTION     TONSILLECTOMY     TUBAL LIGATION     Social History:   reports that she has been smoking. She uses smokeless tobacco. She reports that she does not drink alcohol and does not use drugs.  Family History  Problem Relation Age of  Onset   Thyroid cancer Mother    Other Father        covid   Heart attack Father        6 months ago   Hepatitis C Father    Hodgkin's lymphoma Brother     Medications: Patient's Medications  New Prescriptions   No medications on file  Previous Medications   ACETAMINOPHEN (TYLENOL) 500 MG TABLET    Take by mouth.   DICYCLOMINE (BENTYL) 20 MG TABLET    Take 20 mg by mouth 3 (three) times daily before meals.   DIPHENHYDRAMINE HCL (BENADRYL PO)    Take by mouth.   FERROUS SULFATE 324 MG TBEC    ferrous sulfate 324 mg (65 mg iron) tablet,delayed release  TAKE 1 TABLET BY MOUTH ONCE DAILY FOR DAYS   IBUPROFEN (ADVIL) 200 MG TABLET    Take by mouth.   METHADONE (DOLOPHINE) 10 MG TABLET    Take 19 tablets (190 mg total) by mouth daily.  Modified Medications   No medications on file  Discontinued Medications   No medications on file    Physical Exam:  Vitals:   05/24/22 0935  BP: 110/62  Pulse: 77  Temp: (!) 97.1 F (36.2 C)  SpO2: 98%  Weight: 173 lb 3.2 oz (78.6 kg)  Height: '5\' 4"'$  (1.626 m)   Body mass index is 29.73 kg/m. Wt Readings from Last 3 Encounters:  05/24/22 173 lb 3.2 oz (78.6 kg)  01/25/22 178 lb (80.7 kg)  12/11/21 186 lb 12.8 oz (84.7 kg)    Physical Exam unable to perform  Labs reviewed: Basic Metabolic Panel: No results for input(s): "NA", "K", "CL", "CO2", "GLUCOSE", "BUN", "CREATININE", "CALCIUM", "MG", "PHOS", "TSH" in the last 8760 hours. Liver Function Tests: No results for input(s): "AST", "ALT", "ALKPHOS", "BILITOT", "PROT", "ALBUMIN" in the last 8760 hours. No results for input(s): "LIPASE", "AMYLASE" in the last 8760 hours. No results for input(s): "AMMONIA" in the last 8760 hours. CBC: No results for input(s): "WBC", "NEUTROABS", "HGB", "HCT", "MCV", "PLT" in the last 8760 hours. Lipid Panel: No results for input(s): "CHOL", "HDL", "LDLCALC", "TRIG", "CHOLHDL", "LDLDIRECT" in the last 8760 hours. TSH: No results for input(s): "TSH" in  the last 8760 hours. A1C: No results found for: "HGBA1C"   Assessment/Plan There are no diagnoses linked to this encounter.  Next appt:  [place], [facility] [phone]
